# Patient Record
Sex: Male | Born: 1965 | Race: White | Hispanic: No | Marital: Single | Smoking: Never smoker
Health system: Southern US, Community
[De-identification: ages and names within clinical notes are randomized; demographics above are authoritative.]

## PROBLEM LIST (undated history)

## (undated) DIAGNOSIS — G4730 Sleep apnea, unspecified: Secondary | ICD-10-CM

## (undated) DIAGNOSIS — M1990 Unspecified osteoarthritis, unspecified site: Secondary | ICD-10-CM

## (undated) DIAGNOSIS — M19049 Primary osteoarthritis, unspecified hand: Secondary | ICD-10-CM

## (undated) DIAGNOSIS — Z98811 Dental restoration status: Secondary | ICD-10-CM

## (undated) DIAGNOSIS — I10 Essential (primary) hypertension: Secondary | ICD-10-CM

## (undated) DIAGNOSIS — S60419A Abrasion of unspecified finger, initial encounter: Secondary | ICD-10-CM

## (undated) DIAGNOSIS — F319 Bipolar disorder, unspecified: Secondary | ICD-10-CM

## (undated) DIAGNOSIS — G4710 Hypersomnia, unspecified: Secondary | ICD-10-CM

## (undated) DIAGNOSIS — M6740 Ganglion, unspecified site: Secondary | ICD-10-CM

## (undated) DIAGNOSIS — F32A Depression, unspecified: Secondary | ICD-10-CM

## (undated) DIAGNOSIS — F329 Major depressive disorder, single episode, unspecified: Secondary | ICD-10-CM

## (undated) HISTORY — DX: Essential (primary) hypertension: I10

## (undated) HISTORY — PX: TONSILLECTOMY AND ADENOIDECTOMY: SUR1326

## (undated) HISTORY — DX: Depression, unspecified: F32.A

## (undated) HISTORY — DX: Sleep apnea, unspecified: G47.30

## (undated) HISTORY — PX: APPENDECTOMY: SHX54

## (undated) HISTORY — DX: Hypersomnia, unspecified: G47.10

## (undated) HISTORY — PX: CHOLECYSTECTOMY: SHX55

## (undated) HISTORY — DX: Major depressive disorder, single episode, unspecified: F32.9

---

## 2005-05-05 ENCOUNTER — Encounter: Admission: RE | Admit: 2005-05-05 | Discharge: 2005-05-05 | Payer: Self-pay | Admitting: Family Medicine

## 2011-04-30 ENCOUNTER — Inpatient Hospital Stay (INDEPENDENT_AMBULATORY_CARE_PROVIDER_SITE_OTHER)
Admission: RE | Admit: 2011-04-30 | Discharge: 2011-04-30 | Disposition: A | Discharge status: Home / Self Care | Payer: BC Managed Care – PPO | Source: Ambulatory Visit | Attending: Emergency Medicine | Admitting: Emergency Medicine

## 2011-04-30 DIAGNOSIS — S61409A Unspecified open wound of unspecified hand, initial encounter: Secondary | ICD-10-CM

## 2012-11-13 ENCOUNTER — Ambulatory Visit (INDEPENDENT_AMBULATORY_CARE_PROVIDER_SITE_OTHER): Payer: BC Managed Care – PPO | Admitting: Sports Medicine

## 2012-11-13 VITALS — BP 136/70 | Ht 72.0 in | Wt 225.0 lb

## 2012-11-13 DIAGNOSIS — M25579 Pain in unspecified ankle and joints of unspecified foot: Secondary | ICD-10-CM

## 2012-11-13 DIAGNOSIS — M25571 Pain in right ankle and joints of right foot: Secondary | ICD-10-CM

## 2012-11-13 DIAGNOSIS — R269 Unspecified abnormalities of gait and mobility: Secondary | ICD-10-CM

## 2012-11-13 NOTE — Assessment & Plan Note (Signed)
We are getting relatively good correction of his pronation with the sports insoles.  If this does not work adequately to get rid of his symptoms I would consider doing custom orthotics.

## 2012-11-13 NOTE — Assessment & Plan Note (Signed)
Some of his symptoms could be suggestive of Morton's neuroma but more of the same suggestive of tarsal tunnel. This is particularly true as he takes his shoe off and he gets better.  Today we added sports insoles with metatarsal pad and with arch support.  We added these to his over-the-counter Spenco insoles as well.  He will try these over the next 6 weeks to see if this lessens his symptoms.

## 2012-11-13 NOTE — Progress Notes (Signed)
  Subjective:    Patient ID: Christopher Collins, male    DOB: Mar 30, 1966, 47 y.o.   MRN: [id number]  HPI  Pt presents to clinic for evaluation of rt foot pain since 2008. Has a burning sensation in rt forefoot with driving mostly.  Burning sensation gets better with taking shoe off to drive. Has improved with getting wider shoes and spinco OTC orthotic.   In the last few months has developed burning sensation with running on treadmill.  Has been told he has a probable neuroma but his testing has never confirmed that    Review of Systems     Objective:   Physical Exam  NAD   Rt foot  No abnormal callusing over MT area Cavus foot in seated position Loss of Rt long arch more than on lt Mild flattening of trans arch on rt, not on lt Slight calcaneal varus  Strikes in supination with walking, no abnormal pronation Good great toe motion Tinnel's at tarsal tunnel - negative  Slight pain over 2nd MT distal shaft Quarter test- hypersensitive on rt 2-3 interspace   Leg lengths equal Good alignment  Running gait - pronates dynamically on rt - forefoot to midfoot strike         Assessment & Plan:

## 2012-12-24 ENCOUNTER — Ambulatory Visit (INDEPENDENT_AMBULATORY_CARE_PROVIDER_SITE_OTHER): Payer: BC Managed Care – PPO | Admitting: Sports Medicine

## 2012-12-24 VITALS — BP 138/81 | Ht 72.0 in | Wt 225.0 lb

## 2012-12-24 DIAGNOSIS — M25579 Pain in unspecified ankle and joints of unspecified foot: Secondary | ICD-10-CM

## 2012-12-24 DIAGNOSIS — M25571 Pain in right ankle and joints of right foot: Secondary | ICD-10-CM

## 2012-12-24 NOTE — Assessment & Plan Note (Signed)
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

## 2012-12-24 NOTE — Progress Notes (Signed)
  Subjective:    Patient ID: Christopher Collins, male    DOB: [birth year], 47 y.o.   MRN: [id number]  HPI  Pt presents to clinic for f/u of rt foot pain which has improved about 25-30% since last visit. Alternating between spinco and green hapad insoles with scaphoid and metatarsal pads on rt. Sxs with running were much less Sxs now with ADLs are very mild Not using any medications and we help off HEP until we saw response   Review of Systems     Objective:   Physical Exam NAD  Rt foot exam: Moderate arch collapse on standing on rt Cavus shape on sitting Posterior tib function diminished on rt  Stands in slight supination  Walks with no limp  No TTP         Assessment & Plan:

## 2012-12-24 NOTE — Patient Instructions (Addendum)
Start Vitamin B6 50 mg twice daily  Do not use ice in that area  Exercises: Walk pigeon toed across a room 5-10 times forward Walk backward with feet in neutral position  Do step ups - with toes turned in 3 sets of 15  Do heel raises with toes turned in 3 sets of  15  Please follow up in 6 weeks  Thank you for seeing Christopher Collins today!

## 2012-12-25 ENCOUNTER — Ambulatory Visit: Payer: BC Managed Care – PPO | Admitting: Sports Medicine

## 2013-02-05 ENCOUNTER — Ambulatory Visit: Payer: BC Managed Care – PPO | Admitting: Sports Medicine

## 2013-02-06 ENCOUNTER — Encounter: Payer: Self-pay | Admitting: Neurology

## 2013-02-06 ENCOUNTER — Ambulatory Visit (INDEPENDENT_AMBULATORY_CARE_PROVIDER_SITE_OTHER): Payer: BC Managed Care – PPO | Admitting: Neurology

## 2013-02-06 VITALS — BP 136/84 | HR 68 | Temp 98.3°F | Ht 73.0 in | Wt 233.0 lb

## 2013-02-06 DIAGNOSIS — G4730 Sleep apnea, unspecified: Secondary | ICD-10-CM

## 2013-02-06 DIAGNOSIS — G4733 Obstructive sleep apnea (adult) (pediatric): Secondary | ICD-10-CM

## 2013-02-06 DIAGNOSIS — G4700 Insomnia, unspecified: Secondary | ICD-10-CM

## 2013-02-06 MED ORDER — ZOLPIDEM TARTRATE 10 MG PO TABS: 10.0000 mg | ORAL_TABLET | Freq: Every evening | ORAL | Status: DC | PRN

## 2013-02-06 NOTE — Addendum Note (Signed)
Addended by: Melvyn Novas on: 02/06/2013 11:43 AM   Modules accepted: Orders

## 2013-02-06 NOTE — Progress Notes (Addendum)
[facility]  Provider:  Dr Ashly Yepez Referring Provider: Hollice Espy, MD Primary Care Physician:  Hollice Espy, MD  Chief Complaint  Patient presents with  . Follow-up    sleep study f/u, rm 10    HPI:  Christopher Collins is a 47 y.o. male here as a referral from Dr. Andee Poles  for sleep problems. his PCP is Dr Shaune Pollack . As long was seen on 11/07/2012 and a sleep consult following a split night study the patient hadn't AHI of 26 during his nocturnal study dated 08-20-12 he had reported snoring and nonrestorative sleep resulting in increased and excessive daytime sleepiness. His Epworth sleepiness score was endorsed at 11 points pre study and at 8 points in February .    His PSG was converted to a split study after the AHI of 26 was found, but was a strong supine positional association with the AHI rising to 55 hour in supine sleep. The patient was titrated to a 9 cm water pressure but it almost equally weight well on 8 cm his followup visit in the first 90 days on CPAP showed that she had a residual AHI at 5.9. [facility] download the machine in mid-February the patient now brought his laptop is his appointment with his own data.  Was evidence of central apneas  in February, his last download, and  I reduced his over all pressure to 7 cm, but his AHi is now about 9 /hr , much worse.  He may have more complex apnea.   The graphic data show that the patient seems to have 2-3 periods at night with higher AHI [place] after midnight and are likely related to REM sleep. The patient's belief that he sleeps mostly in nonsupine position, but we will discuss a tennis ball method to make sure that he doesn't resume supine sleep inadvertently . The patient has mild retrognathia. He uses a dental device to advacne his mandibular as well as avoiding bruxism.  The patient has meanwhile discontinued testosterone supplement , and started on Lamictal. The  testosterone seems to have had little at evidence of helping but is fatigued or sleep. I advised him that lamictal , which recently was started  By dr.McK. sometimes causes delayed sleep onset, and  he may want to make sure that he takes a medicine in the morning  Epworth 10 points but  with a note that this was" before Ambien use, otherwise rather 8 points or less" The patient has started in mid April to take Ambien for sleep induction and he now reports that his sleepiness is improved and that he gets restorative sleep at night.  He estimates his nocturnal sleep time at 7 hours, because so between 11 PM at midnight to bed and right is about 7:30 AM.   Nocturia was reported before CPAP he was has now resolved he may have one bathroom break at the most at night. No  breakthrough snoring is reported     Review of Systems: Out of a complete 14 system review, the patient complains of only the following symptoms, and all other reviewed systems are negative.  lites improved daytime sleepiness with an Epworth of 10 pints. Patient uses Ambien to gain more restful, deeper asleep. Patient AH I is increased after recent changes to his CPAP setting.   History   Social History  . Marital Status: Single    Spouse Name: N/A    Number of Children: 0  .  Years of Education: PHD   Occupational History  .  [employer]    non shift worker   Social History Main Topics  . Smoking status: Never Smoker   . Smokeless tobacco: Not on file  . Alcohol Use: Yes     Comment: not more than 4 drinks per week  . Drug Use: No  . Sexually Active: Not on file   Other Topics Concern  . Not on file   Social History Narrative   Epworth on 8 cm CPAP is down to 8 from 11 cm, but nasal pillow doesn't fit as well as expected. Has used CPAP four or more hours nightly, 100% compliance.   His download  showed an AHI of 5.9 and mostly consists of  central apneas.  This indicated too high of a pressure. Suggested reduction to 7 cm  water and 3 cm flex and a pilaire mask to avoid dislodgement and an  air leak. [facility] is DME.   BMI addressed with PCP.  Visit 30 minutes.         No family history on file.  Past Medical History  Diagnosis Date  . Hypertension   . Depression   . Snoring     newly diagnosed OSA-AHI of 26  . Sleep apnea with use of continuous positive airway pressure (CPAP)      PSG  08-20-12 , AHI of 26, CPAP  9 cm water DME  [facility].     Past Surgical History  Procedure Laterality Date  . Gallbladder surgery  2002  . Tonsillectomy  1970    Current Outpatient Prescriptions  Medication Sig Dispense Refill  . amLODipine (NORVASC) 5 MG tablet Take 5 mg by mouth daily.      . Cholecalciferol (VITAMIN D3) 5000 UNITS CAPS Take by mouth 2 (two) times daily.      Marland Kitchen FLUoxetine (PROZAC) 20 MG tablet Take 20 mg by mouth daily.      Marland Kitchen lamoTRIgine (LAMICTAL) 100 MG tablet Take 100 mg by mouth daily.      Marland Kitchen lisinopril (PRINIVIL,ZESTRIL) 10 MG tablet Take 10 mg by mouth daily.      . Methylcobalamin 1 MG CHEW Chew by mouth.      . Multiple Vitamins-Minerals (MULTIVITAMIN PO) Take by mouth daily.      . OMEGA 3 1200 MG CAPS Take by mouth 2 (two) times daily.      . Pregnenolone POWD 100 mg by Does not apply route daily.       No current facility-administered medications for this visit.    Allergies as of 02/06/2013  . (No Known Allergies)    Vitals: BP 136/84  Pulse 68  Temp(Src) 98.3 F (36.8 C) (Oral)  Ht 6\' 1"  (1.854 m)  Wt 233 lb (105.688 kg)  BMI 30.75 kg/m2 Last Weight:  Wt Readings from Last 1 Encounters:  02/06/13 233 lb (105.688 kg)   Last Height:   Ht Readings from Last 1 Encounters:  02/06/13 6\' 1"  (1.854 m)   Vision Screening:   Physical exam:  General: The patient is awake, alert and appears not in acute distress. The patient is well groomed. Head: Normocephalic, atraumatic. Neck is supple. Mallampati 3 , neck circumference:17.5 , retrognathia,   No nasal deviation, can breath  through either nasion. Allergic rhinitis has not been bothering him recently.  Cardiovascular:  Regular rate and rhythm , without  murmurs or carotid bruit, and without distended neck veins. Respiratory: Lungs are clear to auscultation. Skin:  Without evidence of edema, or rash Trunk: BMI is elevated and patient  has normal posture.  Neurologic exam : The patient is awake and alert, oriented to place and time.  Memory subjective  described as intact. There is a normal attention span & concentration ability.  Speech is fluent without  dysarthria, dysphonia or aphasia. Mood and affect are appropriate.  Cranial nerves: Pupils are equal and briskly reactive to light.Extraocular movements  in vertical and horizontal planes intact and without nystagmus. Visual fields by finger perimetry are intact. Hearing to finger rub intact.  Facial sensation intact to fine touch. Facial motor strength is symmetric and tongue and uvula move midline.  Motor exam:   Normal tone and normal muscle bulk and symmetric normal strength in all extremities.  Sensory:  Fine touch, pinprick and vibration were tested as normal.   Coordination: Rapid alternating movements in the fingers/hands is tested and normal. Patient has slight tremor with action.   Gait and station: Patient walks without assistive device and is able and assisted stool climb up to the exam table. Strength within normal limits.Steps are unfragmented.  Deep tendon reflexes: in the  upper and lower extremities are symmetric and intact. Babinski maneuver response is  downgoing.   Assessment:  After physical and neurologic examination, review of laboratory studies, imaging, neurophysiology testing and pre-existing records, assessment will be reviewed on the problem list.  Plan:  Treatment plan and additional workup will be reviewed under Problem List.   , Excessive daytime sleepiness and tested positive for sleep apnea. The patient is on a therapeutic  CPAP, but a recent pressure reduction at night to help him to improve his insomnia and further increased his residual AH I. I have decided to resume an extra exceed the initial pressure of  9 cm water with a 3 cm flex function.  He will continue using his nasal mask, but she prefers over a nasal pillow.  The patient has retrognathia, status post tonsillectomy ,   And has  a high grade Mallampati due to elongated uvula, soft tissue excess.  He is using a dental device at night as a bite guard and mandibular device. .  The dental device preceded the use of CPAP by many years.  His insomnia seems to have responded to the Ambien treatment, initiated by his PCP mid- April, and this is reflected in his Epworth score also he and Doris 2 date 10 pints he states that this was prior to Ambien and may now range around 8 or less. The patient is not a shift worker he has regular bedtimes reports nor of the interruption of his sleep and resolution of nocturia.   I am willing to prescribe the Ambien for him but would like him to use a half tablet at night and to reduce the Ambien to 5 nights a week. The apnea and the insomnia not causally related.

## 2013-02-06 NOTE — Patient Instructions (Addendum)
CPAP and BIPAP CPAP and BIPAP are methods of helping you breathe. CPAP stands for "continuous positive airway pressure." BIPAP stands for "bi-level positive airway pressure." Both CPAP and BIPAP are provided by a small machine with a flexible plastic tube that attaches to a plastic mask that goes over your nose or mouth. Air is blown into your air passages through your nose or mouth. This helps to keep your airways open and helps to keep you breathing well. The amount of pressure that is used to blow the air into your air passages can be set on the machine. The pressure setting is based on your needs. With CPAP, the amount of pressure stays the same while you breathe in and out. With BIPAP, the amount of pressure changes when you inhale and exhale. Your caregiver will recommend whether CPAP or BIPAP would be more helpful for you.  CPAP and BIPAP can be helpful for both adults and children with:  Sleep apnea.  Chronic Obstructive Pulmonary Disease (COPD), a condition like emphysema.  Diseases which weaken the muscles of the chest such as muscular dystrophy or neurological diseases.  Other problems that cause breathing to be weak or difficult. USE OF CPAP OR BIPAP The respiratory therapist or technician will help you get used to wearing the mask. Some people feel claustrophobic (a trapped or closed in feeling) at first, because the mask needs to be fairly snug on your face.   It may help you to get used to the mask gradually, by first holding the mask loosely over your nose or mouth using a low pressure setting on the machine. Gradually the mask can be applied more snugly with increased pressure. You can also gradually increase the amount of time the mask is used.  People with sleep apnea will use the mask and machine at night when they are sleeping. Others, like those with ALS or other breathing difficulties, may need the CPAP or BIPAP all the time.  If the first mask you try does not fit well, or  is uncomfortable, there are other types and sizes that can be tried.  If you tend to breathe through your mouth, a chin strap may be applied to help keep your mouth closed (if you are using a nasal mask).  The CPAP and BIPAP machines have alarms that may sound if the mask comes off or develops a leak.  You should not eat or drink while the CPAP or BIPAP is on. Food or fluids could get pushed into your lungs by the pressure of the CPAP or BIPAP. Sometimes CPAP or BIPAP machines are ordered for home use. If you are going to use the CPAP or BIPAP machine at home, follow these instructions  CPAP or BIPAP machines can be rented or purchased through home health care companies. There are many different brands of machines available. If you rent a machine before purchasing you may find which particular machine works well for you.  Ask questions if there is something you do not understand when picking out your machine.  Place your CPAP or BIPAP machine on a secure table or stand near an electrical outlet.  Know where the On/Off switch is.  Follow your doctor's instructions for how to set the pressure on your machine and when you should use it.  Do not smoke! Tobacco smoke residue can damage the machine. SEEK IMMEDIATE MEDICAL CARE IF:   You have redness or open areas around your nose or mouth.  You have trouble operating   the CPAP or BIPAP machine.  You cannot tolerate wearing the CPAP or BIPAP mask.  You have any questions or concerns. Document Released: 05/27/2004 Document Revised: 11/21/2011 Document Reviewed: 08/26/2008 Exit[employer] Patient Information 2014 [employer]. Sleep Apnea  Sleep apnea is a sleep disorder characterized by abnormal pauses in breathing while you sleep. When your breathing pauses, the level of oxygen in your blood decreases. This causes you to move out of deep sleep and into light sleep. As a result, your quality of sleep is poor, and the system that carries your blood  throughout your body (cardiovascular system) experiences stress. If sleep apnea remains untreated, the following conditions can develop:  High blood pressure (hypertension).  Coronary artery disease.  Inability to achieve or maintain an erection (impotence).  Impairment of your thought process (cognitive dysfunction). There are three types of sleep apnea: 1. Obstructive sleep apnea Pauses in breathing during sleep because of a blocked airway. 2. Central sleep apnea Pauses in breathing during sleep because the area of the brain that controls your breathing does not send the correct signals to the muscles that control breathing. 3. Mixed sleep apnea A combination of both obstructive and central sleep apnea. RISK FACTORS The following risk factors can increase your risk of developing sleep apnea:  Being overweight.  Smoking.  Having narrow passages in your nose and throat.  Being of older age.  Being male.  Alcohol use.  Sedative and tranquilizer use.  Ethnicity. Among individuals younger than 35 years, African Americans are at increased risk of sleep apnea. SYMPTOMS   Difficulty staying asleep.  Daytime sleepiness and fatigue.  Loss of energy.  Irritability.  Loud, heavy snoring.  Morning headaches.  Trouble concentrating.  Forgetfulness.  Decreased interest in sex. DIAGNOSIS  In order to diagnose sleep apnea, your caregiver will perform a physical examination. Your caregiver may suggest that you take a home sleep test. Your caregiver may also recommend that you spend the night in a sleep lab. In the sleep lab, several monitors record information about your heart, lungs, and brain while you sleep. Your leg and arm movements and blood oxygen level are also recorded. TREATMENT The following actions may help to resolve mild sleep apnea:  Sleeping on your side.   Using a decongestant if you have nasal congestion.   Avoiding the use of depressants, including  alcohol, sedatives, and narcotics.   Losing weight and modifying your diet if you are overweight. There also are devices and treatments to help open your airway:  Oral appliances. These are custom-made mouthpieces that shift your lower jaw forward and slightly open your bite. This opens your airway.  Devices that create positive airway pressure. This positive pressure "splints" your airway open to help you breathe better during sleep. The following devices create positive airway pressure:  Continuous positive airway pressure (CPAP) device. The CPAP device creates a continuous level of air pressure with an air pump. The air is delivered to your airway through a mask while you sleep. This continuous pressure keeps your airway open.  Nasal expiratory positive airway pressure (EPAP) device. The EPAP device creates positive air pressure as you exhale. The device consists of single-use valves, which are inserted into each nostril and held in place by adhesive. The valves create very little resistance when you inhale but create much more resistance when you exhale. That increased resistance creates the positive airway pressure. This positive pressure while you exhale keeps your airway open, making it easier to breath when you   inhale again.  Bilevel positive airway pressure (BPAP) device. The BPAP device is used mainly in patients with central sleep apnea. This device is similar to the CPAP device because it also uses an air pump to deliver continuous air pressure through a mask. However, with the BPAP machine, the pressure is set at two different levels. The pressure when you exhale is lower than the pressure when you inhale.  Surgery. Typically, surgery is only done if you cannot comply with less invasive treatments or if the less invasive treatments do not improve your condition. Surgery involves removing excess tissue in your airway to create a wider passage way. Document Released: 08/19/2002 Document  Revised: 02/28/2012 Document Reviewed: 01/05/2012 Exit[employer] Patient Information 2014 [employer]. Exercise to Lose Weight Exercise and a healthy diet may help you lose weight. Your doctor may suggest specific exercises. EXERCISE IDEAS AND TIPS  Choose low-cost things you enjoy doing, such as walking, bicycling, or exercising to workout videos.  Take stairs instead of the elevator.  Walk during your lunch break.  Park your car further away from work or school.  Go to a gym or an exercise class.  Start with 5 to 10 minutes of exercise each day. Build up to 30 minutes of exercise 4 to 6 days a week.  Wear shoes with good support and comfortable clothes.  Stretch before and after working out.  Work out until you breathe harder and your heart beats faster.  Drink extra water when you exercise.  Do not do so much that you hurt yourself, feel dizzy, or get very short of breath. Exercises that burn about 150 calories:  Running 1  miles in 15 minutes.  Playing volleyball for 45 to 60 minutes.  Washing and waxing a car for 45 to 60 minutes.  Playing touch football for 45 minutes.  Walking 1  miles in 35 minutes.  Pushing a stroller 1  miles in 30 minutes.  Playing basketball for 30 minutes.  Raking leaves for 30 minutes.  Bicycling 5 miles in 30 minutes.  Walking 2 miles in 30 minutes.  Dancing for 30 minutes.  Shoveling snow for 15 minutes.  Swimming laps for 20 minutes.  Walking up stairs for 15 minutes.  Bicycling 4 miles in 15 minutes.  Gardening for 30 to 45 minutes.  Jumping rope for 15 minutes.  Washing windows or floors for 45 to 60 minutes. Document Released: 10/01/2010 Document Revised: 11/21/2011 Document Reviewed: 10/01/2010 [employer] Patient Information 2014 [employer].  

## 2013-02-21 ENCOUNTER — Encounter: Payer: Self-pay | Admitting: Neurology

## 2013-02-28 ENCOUNTER — Encounter: Payer: Self-pay | Admitting: Sports Medicine

## 2013-02-28 ENCOUNTER — Ambulatory Visit (INDEPENDENT_AMBULATORY_CARE_PROVIDER_SITE_OTHER): Payer: BC Managed Care – PPO | Admitting: Sports Medicine

## 2013-02-28 VITALS — BP 121/76 | HR 59 | Ht 73.0 in | Wt 237.0 lb

## 2013-02-28 DIAGNOSIS — M25579 Pain in unspecified ankle and joints of unspecified foot: Secondary | ICD-10-CM

## 2013-02-28 DIAGNOSIS — M25571 Pain in right ankle and joints of right foot: Secondary | ICD-10-CM

## 2013-02-28 DIAGNOSIS — R269 Unspecified abnormalities of gait and mobility: Secondary | ICD-10-CM

## 2013-02-28 NOTE — Patient Instructions (Addendum)
You have high impact gait so you need to stay in cushioned insoles to keep from tibial nerve pressure at tarsal tunnel  Scaphoid pads block pronation Metatarsal pad blocks distal nerve irritation (not a true neuroma)  Modify exercises Keep up step ups Start working the pigeon toed heel lift on a book and not a step until RT ankle is stronger Use the left as your control and advance to a step if it gets stronger  OK to ease into some running when weather allows  Shoes - curve last with good midfoot support/ no rigid pronation counter  See me in 6 mos

## 2013-02-28 NOTE — Assessment & Plan Note (Signed)
This is significantly improved  He is given a catalog to buy the sports insoles and the various pads-scaphoid for the arch and metatarsal pad  Continue on exercises and the vitamin B6

## 2013-02-28 NOTE — Progress Notes (Signed)
Patient ID: MENDELL BONTEMPO, male   DOB: 1966-01-03, 47 y.o.   MRN: [id number]  Since last seen Step up exercises were fine as were walking drills Eccentric on step were painful in arch and caused some sharp nerve pain Insoles are helpful Pain reduced by 40% Also takes B6 Walking briskly for 45 mins most days and he does some running  Doing the exercises he has noted that his right ankle is weaker for heel raises and dorsiflexion  Compared to first visit he has no pain with driving or in activities of daily living  No acute distress  RT Ankle: No visible erythema or swelling. Range of motion is full in all directions. Strength is 5/5 in all directions. Stable lateral and medial ligaments; squeeze test and kleiger test unremarkable; Talar dome nontender; No pain at base of 5th MT; No tenderness over cuboid; No tenderness over N spot or navicular prominence No tenderness on posterior aspects of lateral and medial malleolus No sign of peroneal tendon subluxations; Negative tarsal tunnel tinel's Able to walk 4 steps.  Cavus-type arch but some arch collapse when he stands No tenderness over the distal metatarsals  Running gait Forefoot strike with some varus Pronation is controlled with the scaphoid arch support on the right

## 2013-02-28 NOTE — Assessment & Plan Note (Signed)
He is getting good results with sports insoles  We will defer making custom orthotics  Recheck with me in 6 months or when necessary

## 2013-03-04 ENCOUNTER — Other Ambulatory Visit: Payer: Self-pay | Admitting: Orthopedic Surgery

## 2013-03-12 DIAGNOSIS — M19049 Primary osteoarthritis, unspecified hand: Secondary | ICD-10-CM

## 2013-03-12 DIAGNOSIS — M6740 Ganglion, unspecified site: Secondary | ICD-10-CM

## 2013-03-12 HISTORY — DX: Primary osteoarthritis, unspecified hand: M19.049

## 2013-03-12 HISTORY — PX: FINGER SURGERY: SHX640

## 2013-03-12 HISTORY — DX: Ganglion, unspecified site: M67.40

## 2013-03-21 ENCOUNTER — Encounter (HOSPITAL_BASED_OUTPATIENT_CLINIC_OR_DEPARTMENT_OTHER): Payer: Self-pay | Admitting: *Deleted

## 2013-03-21 NOTE — Pre-Procedure Instructions (Signed)
To come for EKG 

## 2013-03-25 ENCOUNTER — Encounter (HOSPITAL_BASED_OUTPATIENT_CLINIC_OR_DEPARTMENT_OTHER)
Admission: RE | Admit: 2013-03-25 | Discharge: 2013-03-25 | Disposition: A | Discharge status: Home / Self Care | Payer: BC Managed Care – PPO | Source: Ambulatory Visit | Attending: Orthopedic Surgery | Admitting: Orthopedic Surgery

## 2013-03-25 NOTE — Progress Notes (Signed)
EKG reviewed by Dr Chaney Malling. Obtain old EKG from primary. 1235 Old EKG reviewed by Dr Chaney Malling. No new orders stable EKG at this time

## 2013-03-27 ENCOUNTER — Encounter (HOSPITAL_BASED_OUTPATIENT_CLINIC_OR_DEPARTMENT_OTHER): Payer: Self-pay | Admitting: Anesthesiology

## 2013-03-27 ENCOUNTER — Encounter (HOSPITAL_BASED_OUTPATIENT_CLINIC_OR_DEPARTMENT_OTHER)
Admission: RE | Disposition: A | Discharge status: Home / Self Care | Payer: Self-pay | Source: Ambulatory Visit | Attending: Orthopedic Surgery

## 2013-03-27 ENCOUNTER — Ambulatory Visit (HOSPITAL_BASED_OUTPATIENT_CLINIC_OR_DEPARTMENT_OTHER)
Admission: RE | Admit: 2013-03-27 | Discharge: 2013-03-27 | Disposition: A | Discharge status: Home / Self Care | Payer: BC Managed Care – PPO | Source: Ambulatory Visit | Attending: Orthopedic Surgery | Admitting: Orthopedic Surgery

## 2013-03-27 ENCOUNTER — Ambulatory Visit (HOSPITAL_BASED_OUTPATIENT_CLINIC_OR_DEPARTMENT_OTHER): Payer: BC Managed Care – PPO | Admitting: Anesthesiology

## 2013-03-27 ENCOUNTER — Encounter (HOSPITAL_BASED_OUTPATIENT_CLINIC_OR_DEPARTMENT_OTHER): Payer: Self-pay | Admitting: Orthopedic Surgery

## 2013-03-27 DIAGNOSIS — Z91011 Allergy to milk products: Secondary | ICD-10-CM | POA: Insufficient documentation

## 2013-03-27 DIAGNOSIS — I10 Essential (primary) hypertension: Secondary | ICD-10-CM | POA: Insufficient documentation

## 2013-03-27 DIAGNOSIS — D2110 Benign neoplasm of connective and other soft tissue of unspecified upper limb, including shoulder: Secondary | ICD-10-CM | POA: Insufficient documentation

## 2013-03-27 DIAGNOSIS — H9319 Tinnitus, unspecified ear: Secondary | ICD-10-CM | POA: Insufficient documentation

## 2013-03-27 DIAGNOSIS — F3130 Bipolar disorder, current episode depressed, mild or moderate severity, unspecified: Secondary | ICD-10-CM | POA: Insufficient documentation

## 2013-03-27 DIAGNOSIS — M19049 Primary osteoarthritis, unspecified hand: Secondary | ICD-10-CM | POA: Insufficient documentation

## 2013-03-27 DIAGNOSIS — Z79899 Other long term (current) drug therapy: Secondary | ICD-10-CM | POA: Insufficient documentation

## 2013-03-27 DIAGNOSIS — M47812 Spondylosis without myelopathy or radiculopathy, cervical region: Secondary | ICD-10-CM | POA: Insufficient documentation

## 2013-03-27 DIAGNOSIS — Z91018 Allergy to other foods: Secondary | ICD-10-CM | POA: Insufficient documentation

## 2013-03-27 DIAGNOSIS — G4730 Sleep apnea, unspecified: Secondary | ICD-10-CM | POA: Insufficient documentation

## 2013-03-27 HISTORY — DX: Bipolar disorder, unspecified: F31.9

## 2013-03-27 HISTORY — DX: Primary osteoarthritis, unspecified hand: M19.049

## 2013-03-27 HISTORY — DX: Abrasion of unspecified finger, initial encounter: S60.419A

## 2013-03-27 HISTORY — DX: Unspecified osteoarthritis, unspecified site: M19.90

## 2013-03-27 HISTORY — DX: Dental restoration status: Z98.811

## 2013-03-27 HISTORY — DX: Ganglion, unspecified site: M67.40

## 2013-03-27 LAB — POCT I-STAT, CHEM 8
Hemoglobin: 15.6 g/dL (ref 13.0–17.0)
Sodium: 141 mEq/L (ref 135–145)
TCO2: 25 mmol/L (ref 0–100)

## 2013-03-27 SURGERY — EXCISION METACARPAL MASS
Anesthesia: Regional | Site: Hand | Laterality: Right | Wound class: Clean

## 2013-03-27 MED ORDER — OXYCODONE HCL 5 MG PO TABS: 5.0000 mg | ORAL_TABLET | Freq: Once | ORAL | Status: DC | PRN

## 2013-03-27 MED ORDER — PROPOFOL INFUSION 10 MG/ML OPTIME
INTRAVENOUS | Status: DC | PRN
  Administered 2013-03-27: 75 ug/kg/min via INTRAVENOUS

## 2013-03-27 MED ORDER — ONDANSETRON HCL 4 MG/2ML IJ SOLN
INTRAMUSCULAR | Status: DC | PRN
  Administered 2013-03-27: 4 mg via INTRAVENOUS

## 2013-03-27 MED ORDER — CHLORHEXIDINE GLUCONATE 4 % EX LIQD: 60.0000 mL | Freq: Once | CUTANEOUS | Status: DC

## 2013-03-27 MED ORDER — MIDAZOLAM HCL 2 MG/ML PO SYRP: 12.0000 mg | ORAL_SOLUTION | Freq: Once | ORAL | Status: DC | PRN

## 2013-03-27 MED ORDER — HYDROCODONE-ACETAMINOPHEN 5-325 MG PO TABS: 1.0000 | ORAL_TABLET | Freq: Four times a day (QID) | ORAL | Status: DC | PRN

## 2013-03-27 MED ORDER — MIDAZOLAM HCL 2 MG/2ML IJ SOLN: 1.0000 mg | INTRAMUSCULAR | Status: DC | PRN

## 2013-03-27 MED ORDER — OXYCODONE HCL 5 MG/5ML PO SOLN: 5.0000 mg | Freq: Once | ORAL | Status: DC | PRN

## 2013-03-27 MED ORDER — FENTANYL CITRATE 0.05 MG/ML IJ SOLN
50.0000 ug | INTRAMUSCULAR | Status: DC | PRN
Start: 2013-03-27 — End: 2013-03-27

## 2013-03-27 MED ORDER — LACTATED RINGERS IV SOLN
INTRAVENOUS | Status: DC
  Administered 2013-03-27: 11:00:00 via INTRAVENOUS

## 2013-03-27 MED ORDER — ONDANSETRON HCL 4 MG/2ML IJ SOLN: 4.0000 mg | Freq: Once | INTRAMUSCULAR | Status: DC | PRN

## 2013-03-27 MED ORDER — FENTANYL CITRATE 0.05 MG/ML IJ SOLN
INTRAMUSCULAR | Status: DC | PRN
  Administered 2013-03-27: 50 ug via INTRAVENOUS

## 2013-03-27 MED ORDER — HYDROMORPHONE HCL PF 1 MG/ML IJ SOLN: 0.2500 mg | INTRAMUSCULAR | Status: DC | PRN

## 2013-03-27 MED ORDER — CEFAZOLIN SODIUM-DEXTROSE 2-3 GM-% IV SOLR
2.0000 g | INTRAVENOUS | Status: AC
  Administered 2013-03-27: 2 g via INTRAVENOUS

## 2013-03-27 MED ORDER — LIDOCAINE HCL (CARDIAC) 20 MG/ML IV SOLN
INTRAVENOUS | Status: DC | PRN
  Administered 2013-03-27: 50 mg via INTRAVENOUS

## 2013-03-27 MED ORDER — DEXAMETHASONE SODIUM PHOSPHATE 10 MG/ML IJ SOLN
INTRAMUSCULAR | Status: DC | PRN
  Administered 2013-03-27: 10 mg via INTRAVENOUS

## 2013-03-27 MED ORDER — MIDAZOLAM HCL 5 MG/5ML IJ SOLN
INTRAMUSCULAR | Status: DC | PRN
  Administered 2013-03-27: 2 mg via INTRAVENOUS

## 2013-03-27 MED ORDER — BUPIVACAINE HCL (PF) 0.25 % IJ SOLN
INTRAMUSCULAR | Status: DC | PRN
  Administered 2013-03-27: 6 mL

## 2013-03-27 MED ORDER — MEPERIDINE HCL 25 MG/ML IJ SOLN: 6.2500 mg | INTRAMUSCULAR | Status: DC | PRN

## 2013-03-27 SURGICAL SUPPLY — 52 items
BANDAGE COBAN STERILE 2 (GAUZE/BANDAGES/DRESSINGS) IMPLANT
BANDAGE GAUZE ELAST BULKY 4 IN (GAUZE/BANDAGES/DRESSINGS) IMPLANT
BLADE MINI RND TIP GREEN BEAV (BLADE) IMPLANT
BLADE SURG 15 STRL LF DISP TIS (BLADE) ×1 IMPLANT
BLADE SURG 15 STRL SS (BLADE) ×1
BNDG COHESIVE 1X5 TAN STRL LF (GAUZE/BANDAGES/DRESSINGS) IMPLANT
BNDG COHESIVE 3X5 TAN STRL LF (GAUZE/BANDAGES/DRESSINGS) IMPLANT
BNDG ESMARK 4X9 LF (GAUZE/BANDAGES/DRESSINGS) ×2 IMPLANT
CHLORAPREP W/TINT 26ML (MISCELLANEOUS) ×2 IMPLANT
CLOTH BEACON ORANGE TIMEOUT ST (SAFETY) ×2 IMPLANT
CORDS BIPOLAR (ELECTRODE) ×2 IMPLANT
COVER MAYO STAND STRL (DRAPES) ×2 IMPLANT
COVER TABLE BACK 60X90 (DRAPES) ×2 IMPLANT
CUFF TOURNIQUET SINGLE 18IN (TOURNIQUET CUFF) ×2 IMPLANT
DECANTER SPIKE VIAL GLASS SM (MISCELLANEOUS) IMPLANT
DRAIN PENROSE 1/2X12 LTX STRL (WOUND CARE) IMPLANT
DRAPE EXTREMITY T 121X128X90 (DRAPE) ×2 IMPLANT
DRAPE SURG 17X23 STRL (DRAPES) ×2 IMPLANT
GAUZE XEROFORM 1X8 LF (GAUZE/BANDAGES/DRESSINGS) ×2 IMPLANT
GLOVE BIO SURGEON STRL SZ 6.5 (GLOVE) ×2 IMPLANT
GLOVE BIOGEL PI IND STRL 7.0 (GLOVE) ×1 IMPLANT
GLOVE BIOGEL PI IND STRL 8.5 (GLOVE) ×1 IMPLANT
GLOVE BIOGEL PI INDICATOR 7.0 (GLOVE) ×1
GLOVE BIOGEL PI INDICATOR 8.5 (GLOVE) ×1
GLOVE ECLIPSE 6.5 STRL STRAW (GLOVE) ×2 IMPLANT
GLOVE SURG ORTHO 8.0 STRL STRW (GLOVE) ×2 IMPLANT
GOWN BRE IMP PREV XXLGXLNG (GOWN DISPOSABLE) ×2 IMPLANT
GOWN PREVENTION PLUS XLARGE (GOWN DISPOSABLE) ×2 IMPLANT
NDL SAFETY ECLIPSE 18X1.5 (NEEDLE) ×1 IMPLANT
NEEDLE 27GAX1X1/2 (NEEDLE) ×2 IMPLANT
NEEDLE HYPO 18GX1.5 SHARP (NEEDLE) ×1
NS IRRIG 1000ML POUR BTL (IV SOLUTION) ×2 IMPLANT
PACK BASIN DAY SURGERY FS (CUSTOM PROCEDURE TRAY) ×2 IMPLANT
PAD CAST 3X4 CTTN HI CHSV (CAST SUPPLIES) IMPLANT
PADDING CAST ABS 3INX4YD NS (CAST SUPPLIES)
PADDING CAST ABS 4INX4YD NS (CAST SUPPLIES) ×1
PADDING CAST ABS COTTON 3X4 (CAST SUPPLIES) IMPLANT
PADDING CAST ABS COTTON 4X4 ST (CAST SUPPLIES) ×1 IMPLANT
PADDING CAST COTTON 3X4 STRL (CAST SUPPLIES)
SPLINT FINGER 5/8X3.25 (SOFTGOODS) ×1 IMPLANT
SPLINT FINGER FOAM 3 9119 05 (SOFTGOODS) ×2
SPLINT PLASTER CAST XFAST 3X15 (CAST SUPPLIES) IMPLANT
SPLINT PLASTER XTRA FASTSET 3X (CAST SUPPLIES)
SPONGE GAUZE 4X4 12PLY (GAUZE/BANDAGES/DRESSINGS) ×2 IMPLANT
STOCKINETTE 4X48 STRL (DRAPES) ×2 IMPLANT
SUT VIC AB 4-0 P2 18 (SUTURE) IMPLANT
SUT VICRYL RAPID 5 0 P 3 (SUTURE) ×2 IMPLANT
SUT VICRYL RAPIDE 4/0 PS 2 (SUTURE) IMPLANT
SYR BULB 3OZ (MISCELLANEOUS) ×2 IMPLANT
SYR CONTROL 10ML LL (SYRINGE) IMPLANT
TOWEL OR 17X24 6PK STRL BLUE (TOWEL DISPOSABLE) ×4 IMPLANT
UNDERPAD 30X30 INCONTINENT (UNDERPADS AND DIAPERS) ×2 IMPLANT

## 2013-03-27 NOTE — H&P (Signed)
Christopher Collins is a 47 year-old left-hand dominant male who  has had a mucoid cyst rupture on the right middle finger distal interphalangeal joint.  This has been present for several months.  It has been drained several times.  He had one on his little finger also drained several times.  He was given Keflex.  The right small finger resolved after two drainages.  He has no history of injuries, no history of diabetes, thyroid problems, arthritis or gout.  There is a family history of diabetes and arthritis.  He was started on the Keflex the end of last week.  He complains of an intermittent, moderate, sharp, throbbing pain with a feeling of swelling. He complains of PIP and DIP joints of all fingers both hands.    ALLERGIES:     None.  MEDICATIONS:     Cephalexin, Lisinopril, amlodipine besylate, lamotrigine, fluoxetine HCL and   Ambien.   SURGICAL HISTORY:     Cholecystectomy and tonsillectomy.  FAMILY MEDICAL HISTORY:      Positive for diabetes, heart disease, high blood pressure and arthritis.    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   REVIEW OF SYSTEMS:     Positive for glasses, ringing in his ears, high blood pressure, depression and sleep disorder, otherwise negative.  Christopher Collins is an 47 y.o. male.   Chief Complaint: mucoid tumor rt middle finger HPI: see above  Past Medical History  Diagnosis Date  . Depression   . Arthritis     hands  . Sleep apnea with use of continuous positive airway pressure (CPAP)   . Hypertension     under control with meds., has been on med. x 2 yr.  . Bipolar disorder     states mild  . 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 Dental crown present     also dental cap - lower  . Abrasion of finger of right hand 03/21/2013    middle finger    Past Surgical History  Procedure Laterality Date  . Cholecystectomy   2000s  . Tonsillectomy and adenoidectomy      as a child    History reviewed. No pertinent family history. Social History:  reports that he has never smoked. He has never used smokeless tobacco. He reports that  drinks alcohol. He reports that he does not use illicit drugs.  Allergies:  Allergies  Allergen Reactions  . Gluten Meal Other (See Comments)    GI UPSET  . Milk-Related Compounds Other (See Comments)    GI UPSET    No prescriptions prior to admission    No results found for this or any previous visit (from the past 48 hour(s)).  No results found.   Pertinent items are noted in HPI.  Height 6' (1.829 m), weight 103.42 kg (228 lb).  General appearance: alert, cooperative and appears stated age Head: Normocephalic, without obvious abnormality Neck: no carotid bruit and no JVD Resp: clear to auscultation bilaterally Cardio: regular rate and rhythm, S1, S2 normal, no murmur, click, rub or gallop GI: soft, non-tender; bowel sounds normal; no masses,  no organomegaly Extremities: extremities normal, atraumatic, no cyanosis or edema Pulses: 2+ and symmetric Skin: Skin color, texture, turgor normal. No rashes or lesions Neurologic: Grossly normal Incision/Wound: na  Assessment/Plan X-rays of his hand reveal PIP DIP  arthritis all fingers.    DIAGNOSIS:    Degenerative arthritis, cervical spondylosis, PIP/DIP arthritis with mucoid cyst right middle finger.  Transillumination reveals the cyst is present. We have discussed with him the possibility of excision and debridement of the joint right middle finger. The pre, peri and post op course are discussed along with risks and complications.  He is aware there is no guarantee with surgery, possibility of infection, recurrence, injury to arteries, nerves and tendons, incomplete relief of symptoms and dystrophy.  He is scheduled for excision mucoid cyst, debridement DIP joint right middle finger as an outpatient under regional  anesthesia. Questions were invited and answered to the patient's satisfaction.     Aubrianna Orchard R 03/27/2013, 8:01 AM

## 2013-03-27 NOTE — Op Note (Signed)
Dictation Number [id number]

## 2013-03-27 NOTE — Anesthesia Postprocedure Evaluation (Signed)
Anesthesia Post Note  Patient: Christopher Collins  Procedure(s) Performed: Procedure(s) (LRB): EXCISION MUCOID RIGHT MIDDLE FINGER, DEBRIDEMENT DISTAL INTERPHALAGEAL RIGHT MIDDLE FINGER (Right)  Anesthesia type: general  Patient location: PACU  Post pain: Pain level controlled  Post assessment: Patient's Cardiovascular Status Stable  Last Vitals:  Filed Vitals:   03/27/13 1345  BP: 127/78  Pulse: 55  Temp: 36.8 C  Resp: 16    Post vital signs: Reviewed and stable  Level of consciousness: sedated  Complications: No apparent anesthesia complications

## 2013-03-27 NOTE — Brief Op Note (Signed)
03/27/2013  12:13 PM  PATIENT:  Christopher Collins  47 y.o. male  PRE-OPERATIVE DIAGNOSIS:  MUCOID TUMOR RIGHT MIDDLE FINGER, DEGENERATIVE JOINT DISEASE  POST-OPERATIVE DIAGNOSIS:  MUCOID TUMOR RIGHT MIDDLE FINGER,   PROCEDURE:  Procedure(s): EXCISION MUCOID RIGHT MIDDLE FINGER, DEBRIDEMENT DISTAL INTERPHALAGEAL RIGHT MIDDLE FINGER (Right)  SURGEON:  Surgeon(s) and Role:    * Nicki Reaper, MD - Primary  PHYSICIAN ASSISTANT:   ASSISTANTS: none   ANESTHESIA:   local and regional  EBL:     BLOOD ADMINISTERED:none  DRAINS: none   LOCAL MEDICATIONS USED:  MARCAINE     SPECIMEN:  Excision  DISPOSITION OF SPECIMEN:  PATHOLOGY  COUNTS:  YES  TOURNIQUET:   Total Tourniquet Time Documented: Forearm (Right) - 23 minutes Total: Forearm (Right) - 23 minutes   DICTATION: .Other Dictation: Dictation Number [id number]  PLAN OF CARE: Discharge to home after PACU  PATIENT DISPOSITION:  PACU - hemodynamically stable.

## 2013-03-27 NOTE — Transfer of Care (Signed)
Immediate Anesthesia Transfer of Care Note  Patient: Christopher Collins  Procedure(s) Performed: Procedure(s): EXCISION MUCOID RIGHT MIDDLE FINGER, DEBRIDEMENT DISTAL INTERPHALAGEAL RIGHT MIDDLE FINGER (Right)  Patient Location: PACU  Anesthesia Type:Bier block  Level of Consciousness: awake, oriented and patient cooperative  Airway & Oxygen Therapy: Patient Spontanous Breathing and Patient connected to face mask oxygen  Post-op Assessment: Report given to PACU RN and Post -op Vital signs reviewed and stable  Post vital signs: Reviewed and stable  Complications: No apparent anesthesia complications

## 2013-03-27 NOTE — Anesthesia Preprocedure Evaluation (Signed)
Anesthesia Evaluation  Patient identified by MRN, date of birth, ID band Patient awake    Reviewed: Allergy & Precautions, H&P , NPO status   Airway Mallampati: I TM Distance: >3 FB Neck ROM: Full    Dental   Pulmonary sleep apnea and Continuous Positive Airway Pressure Ventilation ,          Cardiovascular hypertension, Pt. on medications     Neuro/Psych Depression    GI/Hepatic   Endo/Other    Renal/GU      Musculoskeletal   Abdominal   Peds  Hematology   Anesthesia Other Findings   Reproductive/Obstetrics                           Anesthesia Physical Anesthesia Plan  ASA: III  Anesthesia Plan: Bier Block   Post-op Pain Management:    Induction: Intravenous  Airway Management Planned: Simple Face Mask  Additional Equipment:   Intra-op Plan:   Post-operative Plan: Extubation in OR  Informed Consent:   Plan Discussed with: CRNA and Surgeon  Anesthesia Plan Comments:         Anesthesia Quick Evaluation

## 2013-03-29 NOTE — Op Note (Signed)
NAME:  AMIRI, RIECHERS        ACCOUNT NO.:  [id number]  MEDICAL RECORD NO.:  [id number]  LOCATION:                               FACILITY:  [facility]  PHYSICIAN:  Cindee Salt, M.D.       DATE OF BIRTH:  [birth year]  DATE OF PROCEDURE:  03/27/2013 DATE OF DISCHARGE:  03/27/2013                              OPERATIVE REPORT   PREOPERATIVE DIAGNOSIS:  Mucoid tumor, degenerative arthritis, right middle finger.  POSTOPERATIVE DIAGNOSIS:  Mucoid tumor, degenerative arthritis, right middle finger.  OPERATION:  Excision of mucoid cyst, debridement of distal interphalangeal joint, right middle finger.  SURGEON:  Cindee Salt, MD  ANESTHESIA:  Forearm-based IV regional with local infiltration metacarpal block.  ANESTHESIOLOGIST:  Kaylyn Layer. Michelle Piper, MD  HISTORY:  The patient is a 47 year old male with a history of recurrent mucoid cyst on distal interphalangeal joint, right middle finger.  He is desirous of having this excised.  Pre, peri, and postoperative course have been discussed along with risks and complications.  He is aware that there is no guarantee with the surgery; possibility of infection; recurrence of injury to arteries, nerves, tendons, incomplete relief of symptoms, dystrophy.  In the preoperative area, the patient was seen, the extremity marked by both the patient and surgeon.  Antibiotic given.  PROCEDURE IN DETAIL:  The patient was brought to the operating room where a forearm-based IV regional anesthetic was carried out without difficulty.  He was prepped using ChloraPrep, supine position with the right arm free.  A 3-minute dry time was allowed.  Time-out taken, confirming the patient and procedure.  A curvilinear incision was made over the distal interphalangeal joint, right middle finger, carried down through subcutaneous tissue.  Bleeders were electrocauterized with bipolar.  A small cyst was immediately encountered at the radial aspect, this was excised sent to  Pathology.  The joint was opened.  A debridement of the distal interphalangeal joint, synovectomy performed. A small incision made on the ulnar aspect, the cyst was not present there.  No further lesions were identified.  The wound was copiously irrigated with saline.  The skin was then closed with interrupted 4-0 Vicryl Rapide sutures.  A metacarpal block was then given with 0.25% Marcaine without epinephrine, 7 mL was used.  Sterile compressive dressing and splint to the finger applied. On deflation of the tourniquet, the remaining fingers pinked.  He was taken to the recovery room for observation in satisfactory condition. He will be discharged home to return to the [facility] [place] in 1 week on Norco.          ______________________________ Cindee Salt, M.D.     GK/MEDQ  D:  03/27/2013  T:  03/28/2013  Job:  [id number]

## 2013-04-17 ENCOUNTER — Ambulatory Visit (INDEPENDENT_AMBULATORY_CARE_PROVIDER_SITE_OTHER): Payer: BC Managed Care – PPO | Admitting: Neurology

## 2013-04-17 ENCOUNTER — Encounter: Payer: Self-pay | Admitting: Neurology

## 2013-04-17 VITALS — BP 130/76 | HR 66 | Resp 16 | Ht 73.0 in | Wt 231.0 lb

## 2013-04-17 DIAGNOSIS — G4700 Insomnia, unspecified: Secondary | ICD-10-CM

## 2013-04-17 DIAGNOSIS — G4730 Sleep apnea, unspecified: Secondary | ICD-10-CM

## 2013-04-17 DIAGNOSIS — G4733 Obstructive sleep apnea (adult) (pediatric): Secondary | ICD-10-CM

## 2013-04-17 DIAGNOSIS — G4710 Hypersomnia, unspecified: Secondary | ICD-10-CM | POA: Insufficient documentation

## 2013-04-17 MED ORDER — ARMODAFINIL 250 MG PO TABS: 250.0000 mg | ORAL_TABLET | Freq: Every day | ORAL | Status: DC

## 2013-04-17 MED ORDER — ZOLPIDEM TARTRATE 10 MG PO TABS: 10.0000 mg | ORAL_TABLET | Freq: Every evening | ORAL | Status: DC | PRN

## 2013-04-17 NOTE — Patient Instructions (Signed)
Insomnia , CPAP back to 8 cm and modafinil started.

## 2013-04-17 NOTE — Progress Notes (Signed)
Chief Complaint  Patient presents with  . Follow-up    OSA, rm 10   A revisit on 04-17-2013. For followup on CPAP compliance and hypersomnia. The patient still endorsed Epworth sleepiness score today at 12 points he underwent surgery on 03-27-13 and had a cyst removed from his middle finger of the right hand, his dominant hand.   He has in the interval time since his last visit with me on 02-06-13 undergone surgery and had a interesting experience. He was apparently snoring loudly before he woke up from his procedure, he did not get a report as to the apnea. He was under conscious sedation for procedure of about 25 minutes duration and when he came back to felt very much refreshed and actually felt like he would like to feel after a night of sleep. He has continued to use the CPAP at the settings of 9 cm water with a 3 cm EPR and has a higher compliance record. Today's in office download covered the time from 519 2 04/16/2013. Indal 75 days his average residual AHI was 4.5 his daily usage 6 hours 12 minutes and he had no major air leaks.  The patient still had many frustrating nights with CPAP mainly because he feels no effect on his daytime sleepiness or fatigue. He had spoken to Dr. Rivka Safer who questioned if he would be a candidate for modafinil or for cerebral. I think he could be a candidate for both. He has remained hypersonic even slightly higher than on the sleep study and orbital on the original would both be medications allowing him to have a better productive day and are not likely to cause insomnia at night. He has been using Ambien at 5 mg as marginal success but responds well to 10 mg. I would rather continue with the Ambien and start Seroquel given the long time side effect profile of either drug.  His data  Down load averaged al days , those with and without use of CPAP , and doubts the correct AHI- he asked if he could be a candidate for adapt SV or another modality - given that he  has a complex apnea component.   i will ask for a re-titration on BiPAP or adapt SV , if this is not reveal benefit , I will defer to depression as a possible non organic  factor in his sleepiness. He has tolerated the CPAP rather well, his insomnia is not related to CPAP he stated.  Again , he has less nocturia and no snoring on the machine , which is a benefit , he cannot find a reason for the increasing Epworth score of 12 .     Assessment and plan   plan B )Titrating on BiPAP to see  if the central apnea can be better addressed , Plan A)  start Modafinil 200 mg in AM, Ambien prn at 10 mg day.    Vision Screening:  Physical exam:  General: The patient is awake, alert and appears not in acute distress. The patient is well groomed.  Head: Normocephalic, atraumatic. Neck is supple. Mallampati 3 , neck circumference:17.5 , retrognathia,  No nasal deviation, can breath through either nasion. Allergic rhinitis has not been bothering him recently.  Cardiovascular: Regular rate and rhythm , without murmurs or carotid bruit, and without distended neck veins.  Respiratory: Lungs are clear to auscultation.  Skin: Without evidence of edema, or rash  Trunk: BMI is elevated and patient has normal posture.  Neurologic exam :  The patient is awake and alert, oriented to place and time. Memory subjective described as intact. There is a normal attention span & concentration ability.  Speech is fluent without dysarthria, dysphonia or aphasia. Mood and affect are appropriate.  Cranial nerves:  Pupils are equal and briskly reactive to light.Extraocular movements in vertical and horizontal planes intact and without nystagmus. Visual fields by finger perimetry are intact.  Hearing to finger rub intact. Facial sensation intact to fine touch. Facial motor strength is symmetric and tongue and uvula move midline.  Motor exam: Normal tone and normal muscle bulk and symmetric normal strength in all extremities.   Sensory: Fine touch, pinprick and vibration were tested as normal.  Coordination: Rapid alternating movements in the fingers/hands is tested and normal. Patient has slight tremor with action.  Gait and station: Patient walks without assistive device and is able and assisted stool climb up to the exam table. Strength within normal limits.Steps are unfragmented.  Deep tendon reflexes: in the upper and lower extremities are symmetric and intact. Babinski maneuver response is downgoing.

## 2013-05-10 DIAGNOSIS — H903 Sensorineural hearing loss, bilateral: Secondary | ICD-10-CM | POA: Insufficient documentation

## 2013-07-22 DIAGNOSIS — M7540 Impingement syndrome of unspecified shoulder: Secondary | ICD-10-CM | POA: Insufficient documentation

## 2013-07-22 DIAGNOSIS — F329 Major depressive disorder, single episode, unspecified: Secondary | ICD-10-CM | POA: Insufficient documentation

## 2013-07-22 DIAGNOSIS — M26609 Unspecified temporomandibular joint disorder, unspecified side: Secondary | ICD-10-CM | POA: Insufficient documentation

## 2013-07-22 DIAGNOSIS — E559 Vitamin D deficiency, unspecified: Secondary | ICD-10-CM | POA: Insufficient documentation

## 2013-08-17 ENCOUNTER — Encounter (HOSPITAL_COMMUNITY): Payer: Self-pay | Admitting: Emergency Medicine

## 2013-08-17 ENCOUNTER — Emergency Department (HOSPITAL_COMMUNITY)
Admission: EM | Admit: 2013-08-17 | Discharge: 2013-08-17 | Disposition: A | Discharge status: Home / Self Care | Payer: BC Managed Care – PPO | Source: Home / Self Care | Attending: Emergency Medicine | Admitting: Emergency Medicine

## 2013-08-17 DIAGNOSIS — H6690 Otitis media, unspecified, unspecified ear: Secondary | ICD-10-CM

## 2013-08-17 DIAGNOSIS — H6691 Otitis media, unspecified, right ear: Secondary | ICD-10-CM

## 2013-08-17 NOTE — ED Provider Notes (Signed)
CSN: [id number]     Arrival date & time 08/17/13  0901 History   First MD Initiated Contact with Patient 08/17/13 [id number]     Chief Complaint  Patient presents with  . Otalgia   (Consider location/radiation/quality/duration/timing/severity/associated sxs/prior Treatment) HPI Comments: 47 year old male presents complaining of ear fullness, intermittent ear pains, muffled hearing in the right ear. In all, this has been going on for a couple of months. He is currently under the care of an otolaryngologist for this problem. He had a myringotomy procedure done 3 weeks ago. His symptoms did not improve after this, and in the last couple of days she has started to have some intermittent pains in the ear. He was given mometasone eardrops. The pain started after he started using the drops so he wants to make sure that nothing is wrong in addition, he has some intermittent nasal congestion and cough for the last 2 weeks. The symptoms are very mild. He has these often and uses daily Nasonex for this problem. No fever, chills, headache, dizziness, lightheadedness, vertigo, NVD, blurry vision, ear discharge.  Patient is a 47 y.o. male presenting with ear pain.  Otalgia Associated symptoms: hearing loss   Associated symptoms: no abdominal pain, no cough, no diarrhea, no fever, no neck pain, no rash, no sore throat and no vomiting     Past Medical History  Diagnosis Date  . Depression   . Arthritis     hands  . Sleep apnea with use of continuous positive airway pressure (CPAP)   . Hypertension     under control with meds., has been on med. x 2 yr.  . Bipolar disorder     states mild  . 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 Dental crown present     also dental cap - lower  . Abrasion of finger of right hand 03/21/2013    middle finger   Past Surgical History  Procedure Laterality Date  . Cholecystectomy  2000s  . Tonsillectomy  and adenoidectomy      as a child  . Finger surgery Right 03/2013    right middle finger-cyst removed   No family history on file. History  Substance Use Topics  . Smoking status: Never Smoker   . Smokeless tobacco: Never Used  . Alcohol Use: Yes     Comment: occasionally    Review of Systems  Constitutional: Negative for fever, chills and fatigue.  HENT: Positive for ear pain and hearing loss. Negative for sore throat.   Eyes: Negative for visual disturbance.  Respiratory: Negative for cough and shortness of breath.   Cardiovascular: Negative for chest pain, palpitations and leg swelling.  Gastrointestinal: Negative for nausea, vomiting, abdominal pain, diarrhea and constipation.  Genitourinary: Negative for dysuria, urgency, frequency and hematuria.  Musculoskeletal: Negative for arthralgias, myalgias, neck pain and neck stiffness.  Skin: Negative for rash.  Neurological: Negative for dizziness, weakness and light-headedness.    Allergies  Gluten meal and Milk-related compounds  Home Medications   Current Outpatient Rx  Name  Route  Sig  Dispense  Refill  . lamoTRIgine (LAMICTAL) 100 MG tablet   Oral   Take 100 mg by mouth daily.         Marland Kitchen lisinopril (PRINIVIL,ZESTRIL) 10 MG tablet   Oral   Take 10 mg by mouth daily.         . mometasone (ELOCON)  0.1 % cream   Topical   Apply 1 application topically daily.         Marland Kitchen amLODipine (NORVASC) 5 MG tablet   Oral   Take 5 mg by mouth daily.         . Armodafinil (NUVIGIL) 250 MG tablet   Oral   Take 1 tablet (250 mg total) by mouth daily.   30 tablet   2   . Cholecalciferol (VITAMIN D3) 5000 UNITS CAPS   Oral   Take 10,000 Int'l Units by mouth daily.          Marland Kitchen FLUoxetine (PROZAC) 20 MG tablet   Oral   Take 20 mg by mouth daily.         Marland Kitchen HYDROcodone-acetaminophen (NORCO) 5-325 MG per tablet   Oral   Take 1 tablet by mouth every 6 (six) hours as needed for pain.   30 tablet   0   .  Methylcobalamin 1 MG CHEW   Oral   Chew by mouth.         . OMEGA 3 1200 MG CAPS   Oral   Take 2,400 mg by mouth daily.          . Pregnenolone POWD   Oral   Take 100 mg by mouth daily.          Marland Kitchen pyridOXINE (VITAMIN B-6) 50 MG tablet   Oral   Take 50 mg by mouth daily.         Marland Kitchen zolpidem (AMBIEN) 10 MG tablet   Oral   Take 1 tablet (10 mg total) by mouth at bedtime as needed for sleep.   30 tablet   2    BP 128/87  Pulse 69  Temp(Src) 97.8 F (36.6 C) (Oral)  Resp 18  SpO2 96% Physical Exam  Nursing note and vitals reviewed. Constitutional: He is oriented to person, place, and time. He appears well-developed and well-nourished. No distress.  HENT:  Head: Normocephalic and atraumatic.  Right Ear: Hearing, external ear and ear canal normal. Right ear exhibits lacerations (small incision through the tympanic membrane at the 6:00 position, appears to be healing appropriately). No drainage or swelling. Tympanic membrane is scarred. Tympanic membrane is not erythematous and not bulging. No middle ear effusion.  Left Ear: Hearing, tympanic membrane, external ear and ear canal normal.  Mouth/Throat: Oropharynx is clear and moist. No oropharyngeal exudate.  Eyes: Conjunctivae and EOM are normal. Pupils are equal, round, and reactive to light.  Neck: Normal range of motion. Neck supple.  Pulmonary/Chest: Effort normal. No respiratory distress.  Lymphadenopathy:    He has no cervical adenopathy.  Neurological: He is alert and oriented to person, place, and time. No cranial nerve deficit. Coordination normal.  Skin: Skin is warm and dry. No rash noted. He is not diaphoretic.  Psychiatric: He has a normal mood and affect. Judgment normal.    ED Course  Procedures (including critical care time) Labs Review Labs Reviewed - No data to display Imaging Review No results found.    MDM   1. Otitis, right    It does not seem that these symptoms are worse and there is  no obvious infection. The ear pain is intermittent and mild, and the rest of his symptoms are simply not better since seeing both his primary care physician an otolaryngologist. He needs to followup with the otolaryngologist. Advised that he may try using ibuprofen or Aleve to help with the sensation of ear fullness  which may aid in proper physiologic eustachian tube drainage or mild otitis    Graylon Good, PA-C 08/17/13 [phone]

## 2013-08-17 NOTE — ED Provider Notes (Signed)
Medical screening examination/treatment/procedure(s) were performed by non-physician practitioner and as supervising physician I was immediately available for consultation/collaboration.  Shoshana Johal, M.D.  Temeka Pore C Meliss Fleek, MD 08/17/13 2059 

## 2013-08-17 NOTE — ED Notes (Addendum)
Pt c/o right ear pain/fullness onset 3 days... Had a myringotomy procedure done about 3 weeks ago Given Mometasone 0.1% on both ears Sxs also include: decreased hearing, congestion He saw his PCP on Tuesday for the same reason Denies: f/v/n/d Alert w/no signs of acute distress.

## 2013-08-28 ENCOUNTER — Ambulatory Visit (INDEPENDENT_AMBULATORY_CARE_PROVIDER_SITE_OTHER): Payer: BC Managed Care – PPO | Admitting: Neurology

## 2013-08-28 ENCOUNTER — Encounter: Payer: Self-pay | Admitting: Neurology

## 2013-08-28 VITALS — BP 125/81 | HR 68 | Resp 16 | Ht 73.0 in | Wt 230.0 lb

## 2013-08-28 DIAGNOSIS — G4733 Obstructive sleep apnea (adult) (pediatric): Secondary | ICD-10-CM

## 2013-08-28 DIAGNOSIS — G4710 Hypersomnia, unspecified: Secondary | ICD-10-CM

## 2013-08-28 NOTE — Progress Notes (Signed)
Chief Complaint  Patient presents with  . Sleep Apnea    3 mo f/u/CPAP card was downloaded in clinic durring this visit   A revisit on  08-28-2013.  In all last visit with the patient we have discussed the visit meaning hypersomnia and degree of hypersomnia as being excessive in comparison to the valve treated sleep apnea. Today again I was able to review of the CPAP compliance.  The patient has used  CPAP over the last  94 days his machine in average time of 7 hours and 51 minutes nightly/  his residual AHI is 2.5, the machine is set at 8 cm water the 3 cm EPR. Air leak is well below average. He is 94% compliant with CPAP use. His Epworth score remained at 14 points, FSS at  58 points. The patient still uses Klonopin for his diagnostic diagnosed anxiety disorder and it helps his nocturnal sleep sometimes as well but it does create some daytime hypersomnia. In our last visit I had prescribed Nuvigil at 250 mg  In the morning by mouth. The patient had taken single Dose of 200 mg modafinil  In AM and responded with an excessive anxiety or panic attack.  The next day he tried a half caplet of 125 mg,  but his response was still  The excessive anxiety, perhaps lasting from the previous day. So he has not made an attempt to return to her modafinil at this point and he still feels somewhat more anxious than when he saw me last- prior to the medication being initiated. When he started on Remeron he had some nights with vivid dreams, but no noted dream intrusion, sleep paralysis, cataplexy , nightmares, sleep walking, -talking, and no reported REM BD.    Last visit August 2014:  For follow up on CPAP compliance and hypersomnia. The patient still endorsed Epworth sleepiness score today at 12 points.  He underwent surgery on 03-27-13 and had a cyst removed from his middle finger of the right hand, his dominant hand. He has in the interval time since his last visit with me on 02-06-13 undergone surgery and had a  interesting experience. He was apparently snoring loudly before he woke up from his procedure, he did not get a report as to the apnea.  He was under conscious sedation for  The procedure of about 25 minutes duration and when he came back to felt very much refreshed and actually felt like he would like to feel after a night of sleep.  He has continued to use the CPAP at the settings of 9 cm water with a 3 cm EPR and has a high compliance record.   Today's in office download covered the time from  5-19 through 04/16/2013. - 75 days,  his average residual AHI was 4.5 , his daily usage 6 hours 12 minutes and he had no major air leaks. The patient still had many frustrating nights with CPAP mainly because he feels no effect on his daytime sleepiness or fatigue. He had spoken to Dr. Rivka Safer who questioned if he would be a candidate for modafinil or for cerebral. I think he could be a candidate for both. He has remained hypersomnic even slightly higher since sleep study , craving to have a productive day and not  to cause insomnia at night.     Dr Nolen Mu following him.     ROS in 14 systems :  The patient endorsed a day hearing loss ear pain and tinnitus ringing  in his ear is on both sides, no tobacco use no alcohol use and no recreational drug use was endorsed. The patient still endorsed apnea also this has been treated he has no breakthrough snoring there is no sleep talking sleep walking or acting out dreams that was noticed he is also not in stomach but it comes to sleep initiation he feels that he has had a current for the worse with his anxiety and panic disorder and depression. CPAP through [facility] as DME.     Physical examination:   Vision Screening:  Physical exam:  General: The patient is awake, alert and appears not in acute distress. The patient is well groomed.  Head: Normocephalic, atraumatic. Neck is supple. Mallampati 3 , neck circumference:17.5 , retrognathia,  No nasal  deviation, can breath through either nasion. Allergic rhinitis has not been bothering him recently.  Cardiovascular: Regular rate and rhythm , without murmurs or carotid bruit, and without distended neck veins.  Respiratory: Lungs are clear to auscultation.  Skin: Without evidence of edema, or rash  Trunk: BMI is elevated and patient has normal posture.  Neurologic exam :  The patient is awake and alert, oriented to place and time. Memory subjective described as intact. There is a normal attention span & concentration ability.  Speech is fluent without dysarthria, dysphonia or aphasia. Mood and affect are anxious  and depressed.  Cranial nerves:  Pupils are equal and briskly reactive to light. Hearing to finger rub intact. Facial sensation intact to fine touch.  Facial motor strength is symmetric and tongue and uvula move midline.  Motor exam: Normal tone and normal muscle bulk and symmetric normal strength in all extremities.  Sensory: Fine touch, pinprick and vibration were tested as normal.  Coordination: Rapid alternating movements in the fingers/hands - Patient has slight tremor with action.  Gait and station: Patient walks without assistive device.  Deep tendon reflexes: in the upper and lower extremities are symmetric and intact.     The patient remains excessively fatigued and has a higher than average daytime sleepiness degrees. Also has CPAP has treated his obstructive sleep apnea completely he has nor significant residual apnea activity and he is 94% compliant. It is therefore safe to presume that his apnea has nothing at this point to do with his residual sleepiness. The to be no assessment or resetting of his current pressure and no change in the mask no resetting. He is using CPAP at 8 cm water with 3 cm EPR.  Modafinil was started in August to treat the excessive daytime sleepiness and fatigue I had initially ordered 200 mg generic modafinil in a.am. discharge are to have had a  detrimental effect on the patient especially exacerbation of anxiety. Experience left and panicked and disturbed.  At this time  He is not interested in trying any other stimulant medication and  not the same and lower dose either. 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    Tirzah Fross, MD   cc Andee Poles, MD          Shaune Pollack, MD

## 2013-08-28 NOTE — Patient Instructions (Signed)

## 2013-09-02 ENCOUNTER — Encounter: Payer: Self-pay | Admitting: Neurology

## 2014-02-27 ENCOUNTER — Encounter: Payer: Self-pay | Admitting: Sports Medicine

## 2014-02-27 ENCOUNTER — Ambulatory Visit (INDEPENDENT_AMBULATORY_CARE_PROVIDER_SITE_OTHER): Payer: BC Managed Care – PPO | Admitting: Sports Medicine

## 2014-02-27 VITALS — BP 132/85 | Ht 72.0 in | Wt 225.0 lb

## 2014-02-27 DIAGNOSIS — M25562 Pain in left knee: Secondary | ICD-10-CM

## 2014-02-27 DIAGNOSIS — M25569 Pain in unspecified knee: Secondary | ICD-10-CM

## 2014-02-27 NOTE — Progress Notes (Signed)
Patient ID: Christopher Collins, male   DOB: 01/06/1966, 48 y.o.   MRN: [id number] 47 yo male with a complaint of left knee pain. Onset approximately 2 weeks ago. Started after doing some home repair work in his utility room. He was doing a lot of stooping squatting as well as stepping up on one leg. Describes an achy sensation and stiffness in his knee.  No significant swelling noted. No sharp pain. No catching or locking. Discomfort is more noted when going up or down stairs. No prior knee injuries. Has not taken anything to relieve the symptoms.  Pertinent past medical history: OSA, hypertension  Social history: Former avid runner, nonsmoker  Review of systems: As per history of present illness otherwise all systems negative  Examination: BP 132/85  Ht 6' (1.829 m)  Wt 225 lb (102.059 kg)  BMI 30.51 kg/m2 Well-developed well-nourished 48 year old male awake alert and oriented in no acute distress  Left Knee: Normal to inspection with no erythema or effusion or obvious bony abnormalities. Palpation with mild joint line TTP on med and lat joint line ROM normal in flexion and extension and lower leg rotation. Ligaments with solid consistent endpoints including ACL, PCL, LCL, MCL. Negative Mcmurray's and provocative meniscal tests. Non painful patellar compression. Patellar and quadriceps tendons unremarkable. Hamstring and quadriceps strength is normal.  Examination the right knee is unremarkable  Neurovascularly intact with equal pulses in his bilateral lower extremities  Muscular skeletal ultrasound of the left knee reveals a mild evidence of effusion in the suprapatellar pouch. No obvious meniscal pathology. Mild bone spurring of the medial femoral trochlea with narrowing of the patellofemoral space normal-appearing lateral patellofemoral space

## 2014-02-27 NOTE — Assessment & Plan Note (Signed)
This may be related to mild degenerative changes within the knee. We recommended a body helix compression sleeve. In addition quadriceps strengthening exercises. He'll followup in 4-6 weeks' time.

## 2014-03-26 ENCOUNTER — Ambulatory Visit: Payer: BC Managed Care – PPO | Admitting: Sports Medicine

## 2014-03-27 ENCOUNTER — Encounter: Payer: Self-pay | Admitting: Sports Medicine

## 2014-03-27 ENCOUNTER — Ambulatory Visit (INDEPENDENT_AMBULATORY_CARE_PROVIDER_SITE_OTHER): Payer: BC Managed Care – PPO | Admitting: Sports Medicine

## 2014-03-27 VITALS — BP 127/87 | HR 64 | Ht 72.0 in | Wt 225.0 lb

## 2014-03-27 DIAGNOSIS — G5751 Tarsal tunnel syndrome, right lower limb: Secondary | ICD-10-CM

## 2014-03-27 DIAGNOSIS — M25571 Pain in right ankle and joints of right foot: Secondary | ICD-10-CM

## 2014-03-27 DIAGNOSIS — G5750 Tarsal tunnel syndrome, unspecified lower limb: Secondary | ICD-10-CM

## 2014-03-27 DIAGNOSIS — M25569 Pain in unspecified knee: Secondary | ICD-10-CM

## 2014-03-27 DIAGNOSIS — M25579 Pain in unspecified ankle and joints of unspecified foot: Secondary | ICD-10-CM

## 2014-03-27 DIAGNOSIS — M25562 Pain in left knee: Secondary | ICD-10-CM

## 2014-03-27 NOTE — Assessment & Plan Note (Signed)
Pain is secondary to underlying OA. Patient to continue using body helix. PRN NSAID's if needed. Continue quad strengthening exercises. Resume normal activities.

## 2014-03-27 NOTE — Patient Instructions (Signed)
Continue using the body helix.  You can used NSAID's as needed for pain (Aleve, Ibuprofen)  Resume your normal physical activity.  If you continue to have tarsal tunnel pain, we can alleviate this with orthotics. Followup if desired.

## 2014-03-27 NOTE — Progress Notes (Signed)
  Christopher Collins - 48 y.o. male MRN [id number]  Date of birth: [birth year]  SUBJECTIVE:     48 year old male presents for follow up of left knee pain.  Patient was seen previously on 6/18 for left knee pain.  Ultrasound was done at that time and revealed degenerative changes; it did not show any evidence of meniscal tear.  He was given quad strengthening exercises and given a body helix sleeve.  Today he presents for followup. He reports that he is still having intermittent left knee "discomfort" particularly with physical activity.  He reports compliance with body helix and states that it does help. He is not currently taking any medications for this is pain/discomfort is okay.  He denies any associated popping, locking, clicking.  ROS:     Per HPI  PMH - Reviewed. Significant for OSA.  Social Hx - Nonsmoker. Former runner  OBJECTIVE: BP 127/87  Pulse 64  Ht 6' (1.829 m)  Wt 225 lb (102.059 kg)  BMI 30.51 kg/m2  Physical Exam:  Vital signs are reviewed. General: well appearing gentleman in NAD.' MSK Knee: Inspection -  no erythema or effusion. Prominent tibial tubercles with PMH of osgood schlatter. Palpation normal with no warmth, joint line tenderness, patellar tenderness, or condyle tenderness. ROM - symmetrical lacking of a few degrees of full extension and flexion. Ligaments with solid consistent endpoints including ACL, PCL, LCL, MCL. Negative Mcmurray's. Non painful patellar compression. Patellar glide without crepitus. Patellar and quadriceps tendons unremarkable. Hamstring and quadriceps strength is normal.   Right foot - Pes cavus noted.   ASSESSMENT & PLAN: See problem based charting & AVS for pt instructions.

## 2014-03-27 NOTE — Assessment & Plan Note (Signed)
Patient reports recent increase in pain of the right foot secondary to underlying tarsal tunnel. Patient to continue vitamin B6. Advised patient to followup for custom orthotics he pain continued to persist or worsens.

## 2014-04-07 ENCOUNTER — Other Ambulatory Visit: Payer: Self-pay | Admitting: Neurology

## 2014-04-07 DIAGNOSIS — G4733 Obstructive sleep apnea (adult) (pediatric): Secondary | ICD-10-CM

## 2014-05-14 DIAGNOSIS — K219 Gastro-esophageal reflux disease without esophagitis: Secondary | ICD-10-CM | POA: Insufficient documentation

## 2014-07-28 ENCOUNTER — Telehealth: Payer: Self-pay | Admitting: Neurology

## 2014-07-28 NOTE — Telephone Encounter (Signed)
Left message for patient regarding rescheduling 08/28/14 appointment per Jeani Hawking leaving, rescheduled to Dr. Edwena Felty first available on 09/15/14.

## 2014-08-28 ENCOUNTER — Ambulatory Visit: Payer: BC Managed Care – PPO | Admitting: Nurse Practitioner

## 2014-09-15 ENCOUNTER — Encounter: Payer: Self-pay | Admitting: Neurology

## 2014-09-15 ENCOUNTER — Ambulatory Visit (INDEPENDENT_AMBULATORY_CARE_PROVIDER_SITE_OTHER): Payer: BC Managed Care – PPO | Admitting: Neurology

## 2014-09-15 VITALS — BP 127/81 | HR 68 | Resp 16 | Ht 73.0 in | Wt 221.0 lb

## 2014-09-15 DIAGNOSIS — Z9989 Dependence on other enabling machines and devices: Secondary | ICD-10-CM

## 2014-09-15 DIAGNOSIS — G4733 Obstructive sleep apnea (adult) (pediatric): Secondary | ICD-10-CM

## 2014-09-15 DIAGNOSIS — F329 Major depressive disorder, single episode, unspecified: Secondary | ICD-10-CM

## 2014-09-15 DIAGNOSIS — R5383 Other fatigue: Secondary | ICD-10-CM

## 2014-09-15 NOTE — Progress Notes (Signed)
Chief Complaint  Patient presents with  . RV sleep cpap    Rm 11, alone    09-15-14 [name] is here for his yearly revisit. I saw him last 13 months ago when hypersomnia and fatigue for his main concerns. The patient has been a compliant CPAP user and it was evident that obstructive sleep apnea is not the main reason for the patient's high level of fatigue. He has done well with his CPAP tolerance and compliance. Today he endorsed the Epworth sleepiness score at 7 points which would be an normal range, but his fatigue severity score is at 54 points. This is still somewhat concerning. The patient documented on his software program that he had night sweats without nearly any apnea and then periods were his AHI reached 10 and above. It seems that apneas are clustered in the last 2 hours of sleep mostly around 6 and 7 AM. And these are likely REM dependent apneas. His apnea index here today was 4.2 for the last 30 days him the AHI was 1.1 he uses a machine 9 hours and 8 minutes on average at night 100% compliance for days of use and days of 4 hours of use 100% compliance over the last year. His AHI is 5.3 , the machine is set at 8 cm water with 3 cm EPR.  He v could not tolerate Nuvigil, became hypervigilant and anxious.   Why is he so fatigued ?       08-28-2013.  In all last visit with the patient we have discussed the visit meaning hypersomnia and degree of hypersomnia as being excessive in comparison to the valve treated sleep apnea. Today again I was able to review of the CPAP compliance.  The patient has used  CPAP over the last  94 days his machine in average time of 7 hours and 51 minutes nightly/  his residual AHI is 2.5, the machine is set at 8 cm water the 3 cm EPR. Air leak is well below average. He is 94% compliant with CPAP use. His Epworth score remained at 14 points, FSS at  58 points.  The patient still uses Klonopin for his diagnostic diagnosed anxiety disorder and it  helps his nocturnal sleep sometimes as well but it does create some daytime hypersomnia. In our last visit I had prescribed Nuvigil at 250 mg  In the morning by mouth. The patient had taken single Dose of 200 mg modafinil  In AM and responded with an excessive anxiety or panic attack.  The next day he tried a half caplet of 125 mg,  but his response was still  The excessive anxiety, perhaps lasting from the previous day. So he has not made an attempt to return to her modafinil at this point and he still feels somewhat more anxious than when he saw me last- prior to the medication being initiated. When he started on Remeron he had some nights with vivid dreams, but no noted dream intrusion, sleep paralysis, cataplexy , nightmares, sleep walking, -talking, and no reported REM BD.    Sleep  visit August 2014:  For follow up on CPAP compliance and hypersomnia. The patient still endorsed Epworth sleepiness score today at 12 points.  He underwent surgery on 03-27-13 and had a cyst removed from his middle finger of the right hand, his dominant hand. He has in the interval time since his last visit with me on 02-06-13 undergone surgery and had a interesting experience. He was apparently  snoring loudly before he woke up from his procedure, he did not get a report as to the apnea.  He was under conscious sedation for  The procedure of about 25 minutes duration and when he came back to felt very much refreshed and actually felt like he would like to feel after a night of sleep.  He has continued to use the CPAP at the settings of 9 cm water with a 3 cm EPR and has a high compliance record.   Today's in office download covered the time from  5-19 through 04/16/2013. -  75 days,  his average residual AHI was 4.5 , his daily usage 6 hours 12 minutes and he had no major air leaks. The patient still had many frustrating nights with CPAP - mainly because he feels no effect on his daytime sleepiness or fatigue. He had  spoken to Dr. Alton Revere,  who questioned if he would be a candidate for modafinil or XYREM . I think he could be a candidate for both. He has remained hypersomnic even slightly higher since sleep study , craving to have a productive day and not  to cause insomnia at night.    ROS in 14 systems :  The patient endorsed a day hearing loss ear pain and tinnitus ringing in his ear is on both sides, no tobacco use no alcohol use and no recreational drug use was endorsed. The patient still endorsed apnea also this has been treated he has no breakthrough snoring there is no sleep talking sleep walking or acting out dreams that was noticed he is also not in stomach but it comes to sleep initiation he feels that he has had a current for the worse with his anxiety and panic disorder and depression.  CPAP through [facility] as DME.     Physical examination:   Vision Screening:  Physical exam:  General: The patient is awake, alert and appears not in acute distress. The patient is well groomed.  Head: Normocephalic, atraumatic. Neck is supple. Mallampati 3 , neck circumference:17.5 , retrognathia,  No nasal deviation, can breath through either nasion. Allergic rhinitis has not been bothering him recently.  Cardiovascular: Regular rate and rhythm , without murmurs or carotid bruit, and without distended neck veins.  Respiratory: Lungs are clear to auscultation.  Skin: Without evidence of edema, or rash  Trunk: BMI is elevated and patient has normal posture.  Neurologic exam :  The patient is awake and alert, oriented to place and time. Memory subjective described as intact. There is a normal attention span & concentration ability.  Speech is fluent without dysarthria, dysphonia or aphasia. Mood and affect are anxious  and depressed.  Cranial nerves:  Pupils are equal and briskly reactive to light. Hearing to finger rub intact. Facial sensation intact to fine touch.  Facial motor strength is symmetric and  tongue and uvula move midline.    The patient remains excessively fatigued and no longer has excessive  daytime sleepiness , excellent 100% CPAP compliance, he  has treated his obstructive sleep apnea completely . There needs to  be no  resetting and no change in the mask - He is using CPAP at 8 cm water with 3 cm EPR.  I will place him on an auto titration 6-10 cm with 2 cm EPR.  He has mainly central apneas during REM sleep- this may be physiological.  He needs to continue his vit D supplement.   Last physical on Sep 10, 2014 ,  CBC, lipid , CMET all normal.         Manaia Samad, MD   cc Sheralyn Boatman, MD         Darcus Austin, MD

## 2014-09-27 ENCOUNTER — Encounter: Payer: Self-pay | Admitting: Neurology

## 2014-10-13 ENCOUNTER — Encounter: Payer: Self-pay | Admitting: Neurology

## 2015-08-10 ENCOUNTER — Other Ambulatory Visit: Payer: Self-pay | Admitting: Neurology

## 2015-08-10 MED ORDER — MONTELUKAST SODIUM 10 MG PO TABS: 10.0000 mg | ORAL_TABLET | Freq: Every day | ORAL | Status: AC

## 2015-08-21 ENCOUNTER — Other Ambulatory Visit: Payer: Self-pay | Admitting: Gastroenterology

## 2015-08-21 DIAGNOSIS — R7989 Other specified abnormal findings of blood chemistry: Secondary | ICD-10-CM

## 2015-08-21 DIAGNOSIS — R945 Abnormal results of liver function studies: Principal | ICD-10-CM

## 2015-08-24 ENCOUNTER — Ambulatory Visit
Admission: RE | Admit: 2015-08-24 | Discharge: 2015-08-24 | Disposition: A | Discharge status: Home / Self Care | Payer: BC Managed Care – PPO | Source: Ambulatory Visit | Attending: Gastroenterology | Admitting: Gastroenterology

## 2015-08-24 DIAGNOSIS — R7989 Other specified abnormal findings of blood chemistry: Secondary | ICD-10-CM

## 2015-08-24 DIAGNOSIS — R945 Abnormal results of liver function studies: Principal | ICD-10-CM

## 2015-08-24 NOTE — Telephone Encounter (Signed)
This encounter was created in error - please disregard.

## 2015-08-27 ENCOUNTER — Other Ambulatory Visit: Payer: BC Managed Care – PPO

## 2015-09-08 ENCOUNTER — Encounter: Payer: Self-pay | Admitting: Neurology

## 2015-09-16 ENCOUNTER — Ambulatory Visit: Payer: BC Managed Care – PPO | Admitting: Neurology

## 2016-12-26 ENCOUNTER — Ambulatory Visit (INDEPENDENT_AMBULATORY_CARE_PROVIDER_SITE_OTHER): Payer: BC Managed Care – PPO | Admitting: Sports Medicine

## 2016-12-26 ENCOUNTER — Encounter: Payer: Self-pay | Admitting: Sports Medicine

## 2016-12-26 VITALS — BP 143/84 | Ht 72.0 in | Wt 240.0 lb

## 2016-12-26 DIAGNOSIS — M25562 Pain in left knee: Secondary | ICD-10-CM | POA: Diagnosis not present

## 2016-12-26 NOTE — Assessment & Plan Note (Addendum)
-   Acute on chronic. Suspect OA. Small suprapatellar effusion likely not contributing much to pain. - Continue to let pain guide exercise. Elliptical a good alternative to jogging. - Will obtain left knee xray (AP standing, lateral, sunrise) to better characterize degree of suspected arthritis - If little OA seen, could consider MRI to r/o meniscal injury - Continue to wear compression sleeves - Take acetaminophen prn - Consider steroid injection if knee pain becomes more limiting - Will place PT referral, as would like patient to continue to strengthen quads - Recommended leg raise exercises

## 2016-12-26 NOTE — Progress Notes (Signed)
[facility] Progress Note  Subjective:  Christopher Collins is a 51 y.o. male with history of arthritis (right hand and neck), bipolar disorder, HTN, Osgood-Schlatter, and R tarsal tunnel syndrome who presents for left knee pain. He was originally seen by [facility] 02/2014 for left knee pain after lots of squats and bending down doing repair work in his utility room; found to have mild effusion in suprapatellar pouch, mild bone spurring of medial femoral trochlea and narrowing of medial patellofemoral space on [place]. Since then he has had knee pain on and off, usually worse with increasing activity. For the last 3 weeks, pain has increased, as patient began jogging again. He has been trying to lose weight and wanted to increase exercise to help with that. Prior to that, he had been walking for about 1 hour on the treadmill (3.5-4 miles). He has been alternating jogging and walking about 250 m at a time on the track. Denies any popping sensation in the knee but is concerned he is guarding somewhat. His knee has not given out but has had sensation of almost slipping. He does wear a helix compression sleeve and thinks it provides stability. He has been going to PT at [place] with therapist Voncille Lo once a week, focusing on his knee and low back. Wears a shoe insert with metatarsal pad.   ROS: No decreased sensation, no falls. Does report some pain and tingling over R 4th metatarsal but only if driving long distances.  Objective: Blood pressure (!) 143/84, height 6' (1.829 m), weight 240 lb (108.9 kg). Body mass index is 32.55 kg/m. Constitutional: Obese male in NAD Pulmonary/Chest: No respiratory distress.  Abdominal: Soft. Small diastasis recti.   Musculoskeletal: Bilateral knees appear symmetric. No obvious effusion. Feet slightly abducted. No increased warmth. Mild bilateral crepitus. Point tenderness at medial joint line of bilateral knees closer to patella. Full flexion and extension of  knee. 5/5 strength with hip abduction bilaterally and leg extension and flexion. No pain or popping with McMurray's test bilaterally.  Neurological: No focal deficits.  Skin: Skin is warm and dry. No erythema.  Vitals reviewed  [place] L knee: Small effusion  Assessment/Plan: Pain, joint, knee, left - Acute on chronic. Suspect OA. Small suprapatellar effusion likely not contributing much to pain. - Continue to let pain guide exercise. Elliptical a good alternative to jogging. - Will obtain left knee xray (AP standing, lateral, sunrise) to better characterize degree of suspected arthritis - If little OA seen, could consider MRI to r/o meniscal injury - Continue to wear compression sleeves - Take acetaminophen prn - Consider steroid injection if knee pain becomes more limiting - Will place PT referral, as would like patient to continue to strengthen quads - Recommended leg raise exercises  Follow-up prn and pending knee x-ray results.  Olene Floss, MD [place], PGY-2  Patient seen and evaluated with the resident. I agree with the above plan of care. Treatment options were discussed with the patient today. He will continue with physical therapy adding an isometric quad strengthening and hamstring strengthening exercises. Continue with his compression sleeve. He may continue to increase activity as tolerated but is cautioned against pushing through a lot of knee pain with running. We will get x-rays to evaluate the degree of arthritis present in the patient will follow-up with me in a month or so. We did discuss the possibility of a cortisone injection or further diagnostic imaging if his symptoms persist or worsen.

## 2017-01-09 ENCOUNTER — Ambulatory Visit: Payer: BC Managed Care – PPO | Admitting: Sports Medicine

## 2017-02-03 ENCOUNTER — Ambulatory Visit
Admission: RE | Admit: 2017-02-03 | Discharge: 2017-02-03 | Disposition: A | Discharge status: Home / Self Care | Payer: BC Managed Care – PPO | Source: Ambulatory Visit | Attending: Sports Medicine | Admitting: Sports Medicine

## 2017-02-03 DIAGNOSIS — M25562 Pain in left knee: Secondary | ICD-10-CM

## 2017-02-07 ENCOUNTER — Encounter: Payer: Self-pay | Admitting: Sports Medicine

## 2017-02-07 ENCOUNTER — Ambulatory Visit (INDEPENDENT_AMBULATORY_CARE_PROVIDER_SITE_OTHER): Payer: BC Managed Care – PPO | Admitting: Sports Medicine

## 2017-02-07 DIAGNOSIS — M25571 Pain in right ankle and joints of right foot: Secondary | ICD-10-CM | POA: Diagnosis not present

## 2017-02-07 DIAGNOSIS — M25562 Pain in left knee: Secondary | ICD-10-CM | POA: Diagnosis not present

## 2017-02-07 NOTE — Progress Notes (Signed)
   Subjective:    Patient ID: Christopher Collins, male    DOB: [birth year], 51 y.o.   MRN: [id number]  HPI  Christopher Collins comes in today for follow-up on left knee pain. Overall, his symptoms have improved but he admits that he has not been as active as he would like over the past couple weeks just simply due to being busy. He has returned to doing some light walking on the treadmill and this has not been painful. He has not noticed any swelling. No locking of the knee. This is actually a chronic issue for him. He injured his knee in 2015 after jumping over a fence. He describes intermittent episodes of instability since then. He has been doing his home exercises and wearing his body helix compression sleeve.   Review of Systems As above    Objective:   Physical Exam  Well-developed, well-nourished. No acute distress. Awake alert and oriented 3. Vital signs reviewed  Left knee: Full range of motion. No effusion. He is tender to palpation along the medial joint line with a mildly positive medial Thessaly's. Negative McMurray's. There is a slight amount of laxity with Lachman's and anterior drawer testing but no gross instability appreciated. PCL is intact. Knee is stable to valgus and varus stressing. Neurologically intact distally. Walking without a limp.  X-rays of the left knee including AP, lateral, sunrise view, and 30 flexion view show only minimal degenerative changes throughout the knee. Nothing acute  MSK ultrasound of the left knee shows no effusion. Visualized portion of the medial meniscus appears to be intact.      Assessment & Plan:   Left knee pain likely secondary to degenerative medial meniscal tear with possible remote anterior cruciate ligament injury  Overall, the patient's symptoms have improved. He will continue to increase his activity as tolerated. I've explained to him the importance of continuing with his home exercises. I also explained to him the possibility of a  possible anterior cruciate ligament injury in 2015. If that is the case then I would currently recommend continuing with conservative treatment. We discussed the possibility of an MRI if symptoms worsen. We also discussed the possibility of custom orthotics. He has green sports insoles with metatarsal pads which have been helpful and are quite worn. We've replaced his green sports insoles today. He will follow-up as needed.

## 2017-05-17 ENCOUNTER — Encounter: Payer: Self-pay | Admitting: Sports Medicine

## 2017-05-17 ENCOUNTER — Other Ambulatory Visit: Payer: Self-pay | Admitting: Sports Medicine

## 2017-05-17 ENCOUNTER — Ambulatory Visit (INDEPENDENT_AMBULATORY_CARE_PROVIDER_SITE_OTHER): Payer: BC Managed Care – PPO | Admitting: Sports Medicine

## 2017-05-17 VITALS — BP 132/80 | Ht 72.0 in | Wt 235.0 lb

## 2017-05-17 DIAGNOSIS — M25562 Pain in left knee: Secondary | ICD-10-CM

## 2017-05-17 NOTE — Progress Notes (Signed)
   Subjective:    Patient ID: Christopher Collins, male    DOB: 13-Sep-1965, 51 y.o.   MRN: [id number]  HPI chief complaint: Left knee pain  Patient comes in today with returning left knee pain. He was last seen in the office on May 29. Although his knee pain had improved in the interim it had not completely resolved. He injured it further while moving a bookcase in July. He then suffered a valgus injury a few days after that. Since then he has had persistent medial sided knee pain. No swelling. No feelings of instability. No locking or catching. He has tried several weeks of physical therapy without relief. His symptoms actually go back to 2015 when he injured his knee jumping over a fence. Previous x-rays showed minimal degenerative changes in the knee.    Review of Systems As above    Objective:   Physical Exam  Well-developed, well-nourished. No acute distress  Left knee: Full range of motion. No effusion. He is tender to palpation along the medial joint line with a positive Thessaly's. No tenderness along the lateral joint line. He does have some mild laxity with Lachman's testing and anterior drawer testing. PCL is intact. Knee is stable to valgus and varus stressing. Neurovascularly intact distally.  X-rays are as above      Assessment & Plan:  Persistent left knee pain-rule out medial meniscal tear versus anterior cruciate ligament tear  MRI of the left knee to rule out both a medial meniscal tear and a possible anterior cruciate ligament tear. Phone follow-up with those results when available. We will delineate a more definitive treatment plan based on those findings.

## 2017-12-14 ENCOUNTER — Ambulatory Visit: Payer: BC Managed Care – PPO | Admitting: Sports Medicine

## 2017-12-14 VITALS — BP 126/84 | Ht 72.5 in | Wt 230.0 lb

## 2017-12-14 DIAGNOSIS — M25562 Pain in left knee: Secondary | ICD-10-CM | POA: Diagnosis not present

## 2017-12-15 ENCOUNTER — Encounter: Payer: Self-pay | Admitting: Sports Medicine

## 2017-12-15 NOTE — Progress Notes (Signed)
   Subjective:    Patient ID: Christopher Collins, male    DOB: 09/30/1965, 52 y.o.   MRN: [id number]  HPI chief complaint: Left knee pain  Gerald Stabs comes in today with persistent left knee pain. He was last seen in the office back in September. We had decided at that time to get an MRI of his knee to rule out both a meniscal tear and a possible remote anterior cruciate ligament tear. As his symptoms were improving, he decided to wait on the MRI. He has instead been working diligently with physical therapy and for the most part has improved. However,he still has frequent episodes of pain and instability. He's noticed some mild swelling and stiffness. His pain occurs both with sitting as well as with activity. He localizes it to the medial knee. He denies any locking. No radiating pain down the leg. No numbness or tingling. Previous  x-rays have demonstrated only minimal degenerative changes in the knee.    Review of Systems As above    Objective:   Physical Exam  Well-developed, well-nourished. No acute distress. Awake alert and oriented 3. Vital signs reviewed  Left knee: Full range of motion. No obvious effusion. He remains tender to palpation along the medial joint line with a mildly positive Thessaly's. No tenderness along the lateral joint line. Some mild laxity with Lachman's and anterior drawer testing. PCL is intact. Knee is stable to valgus and varus stressing. Neurovascular intact distally.  X-rays of the left knee are as above      Assessment & Plan:   Left knee pain-rule out medial meniscal tear versus anterior cruciate ligament tear  We are going to reorder the MRI of his left knee. Patient has a strong sensitivity to loud noises and has found a relatively quiet MRI in [place], [place]. He will get me their information and we will schedule the MRI to be done there. I've asked him to obtain a copy of the images and the radiology report for my review. We will delineate further  treatment based on those findings.

## 2018-01-04 ENCOUNTER — Telehealth: Payer: Self-pay | Admitting: *Deleted

## 2018-01-04 DIAGNOSIS — M25562 Pain in left knee: Secondary | ICD-10-CM

## 2018-01-04 NOTE — Telephone Encounter (Signed)
Order to be picked up by patient

## 2018-01-17 ENCOUNTER — Telehealth: Payer: Self-pay | Admitting: Sports Medicine

## 2018-01-17 ENCOUNTER — Encounter: Payer: Self-pay | Admitting: Sports Medicine

## 2018-01-17 NOTE — Telephone Encounter (Signed)
xxx

## 2018-01-17 NOTE — Telephone Encounter (Signed)
  I spoke with the patient on the phone today after reviewing the MRI of his left knee that was done in [place]. It is an unremarkable study.Anterior cruciate ligament is intact. No meniscal tear.No significant degenerative changes. No osteochondral lesion. Based on these findings, I recommend that he continue with physical therapy and work on strengthening both his knee and his hip. I've reassured him that it is okay to continue with activity as tolerated. He is reassured the he has no surgical pathology on this study. Follow-up with me as needed.

## 2018-08-24 ENCOUNTER — Emergency Department (HOSPITAL_COMMUNITY): Payer: BC Managed Care – PPO

## 2018-08-24 ENCOUNTER — Emergency Department (HOSPITAL_COMMUNITY)
Admission: EM | Admit: 2018-08-24 | Discharge: 2018-08-25 | Disposition: A | Discharge status: Home / Self Care | Payer: BC Managed Care – PPO | Attending: Emergency Medicine | Admitting: Emergency Medicine

## 2018-08-24 ENCOUNTER — Other Ambulatory Visit: Payer: Self-pay

## 2018-08-24 ENCOUNTER — Encounter (HOSPITAL_COMMUNITY): Payer: Self-pay

## 2018-08-24 DIAGNOSIS — Y939 Activity, unspecified: Secondary | ICD-10-CM | POA: Insufficient documentation

## 2018-08-24 DIAGNOSIS — I10 Essential (primary) hypertension: Secondary | ICD-10-CM | POA: Insufficient documentation

## 2018-08-24 DIAGNOSIS — Z79899 Other long term (current) drug therapy: Secondary | ICD-10-CM | POA: Insufficient documentation

## 2018-08-24 DIAGNOSIS — W458XXA Other foreign body or object entering through skin, initial encounter: Secondary | ICD-10-CM | POA: Insufficient documentation

## 2018-08-24 DIAGNOSIS — S6992XA Unspecified injury of left wrist, hand and finger(s), initial encounter: Secondary | ICD-10-CM | POA: Diagnosis present

## 2018-08-24 DIAGNOSIS — Z23 Encounter for immunization: Secondary | ICD-10-CM | POA: Diagnosis not present

## 2018-08-24 DIAGNOSIS — S61412A Laceration without foreign body of left hand, initial encounter: Secondary | ICD-10-CM | POA: Insufficient documentation

## 2018-08-24 DIAGNOSIS — Y999 Unspecified external cause status: Secondary | ICD-10-CM | POA: Insufficient documentation

## 2018-08-24 DIAGNOSIS — Y929 Unspecified place or not applicable: Secondary | ICD-10-CM | POA: Diagnosis not present

## 2018-08-24 MED ORDER — TETANUS-DIPHTH-ACELL PERTUSSIS 5-2.5-18.5 LF-MCG/0.5 IM SUSP
0.5000 mL | Freq: Once | INTRAMUSCULAR | Status: DC
  Filled 2018-08-24: qty 0.5

## 2018-08-24 MED ORDER — LIDOCAINE HCL (PF) 1 % IJ SOLN
30.0000 mL | Freq: Once | INTRAMUSCULAR | Status: AC
  Administered 2018-08-25: 30 mL
  Filled 2018-08-24: qty 30

## 2018-08-24 NOTE — ED Triage Notes (Signed)
Pt presents with a laceration to his L thumb. He dropped a Facilities manager on it. Bleeding and pain controlled. Full ROM.

## 2018-08-24 NOTE — ED Provider Notes (Signed)
[facility] DEPT Provider Note   CSN: [id number] Arrival date & time: 08/24/18  2236     History   Chief Complaint Chief Complaint  Patient presents with  . Laceration    HPI RIDWAN BONDY is a 52 y.o. male with history of hypertension, bipolar disorder who presents with laceration to his left hand.  He reports he was moving a piece of woodworking machine when it fell in his hand.  A metal piece cut to lacerations in the palmar aspect of his proximal thumb.  He denies any numbness or tingling.  He has full range of motion of the digit.  He does report he has some pain in the bone. His tetanus is not up-to-date. He is left handed.  HPI  Past Medical History:  Diagnosis Date  . Abrasion of finger of right hand 03/21/2013   middle finger  . Arthritis    hands  . Bipolar disorder ([place])    states mild  . 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 Dental crown present    also dental cap - lower  . Depression   . Hypersomnia with sleep apnea   . Hypertension    under control with meds., has been on med. x 2 yr.  . Mucoid cyst of joint 03/2013   right middle finger  . Sleep apnea with use of continuous positive airway pressure (CPAP)     Patient Active Problem List   Diagnosis Date Noted  . Pain, joint, knee, left 02/27/2014  . OSA on CPAP 08/28/2013  . Hypersomnia with sleep apnea, unspecified 08/28/2013  . Hypersomnia with sleep apnea   . Hypersomnia, persistent 04/17/2013  . Sleep apnea with use of continuous positive airway pressure (CPAP) 02/06/2013  . Pain in joint, ankle and foot 11/13/2012  . Abnormality of gait 11/13/2012    Past Surgical History:  Procedure Laterality Date  . CHOLECYSTECTOMY  2000s  . FINGER SURGERY Right 03/2013   right middle finger-cyst removed  . TONSILLECTOMY AND ADENOIDECTOMY     as a child        Home Medications    Prior to Admission medications   Medication Sig Start Date End  Date Taking? Authorizing Provider  amLODipine (NORVASC) 5 MG tablet Take 5 mg by mouth daily.    [provider]  AMLODIPINE BESYLATE PO Take by mouth.    [provider]  b complex vitamins capsule Take 1 capsule by mouth daily.    [provider]  busPIRone (BUSPAR) 15 MG tablet TAKE AS DIRECTED UP TO 1 TABLET TWICE A DAY 12/07/16   [provider]  Cholecalciferol (VITAMIN D-1000 MAX ST) 1000 units tablet Take by mouth.    [provider]  Cholecalciferol (VITAMIN D3) 5000 UNITS CAPS Take 10,000 Int'l Units by mouth daily.     [provider]  Cyanocobalamin (VITAMIN B-12) 1000 MCG SUBL Take by mouth.    [provider]  cyclobenzaprine (FLEXERIL) 10 MG tablet Take 10 mg by mouth as needed.  07/09/14   [provider]  econazole nitrate 1 % cream 1 APPLICATION TO AFFECTED AREA ONCE A DAY EXTERNALLY 30 DAYS 10/19/16   [provider]  HYDROcodone-acetaminophen (NORCO/VICODIN) 5-325 MG tablet Take by mouth. 03/27/13   [provider]  ketoconazole (NIZORAL) 2 % cream 1 APPLICATION TO AFFECTED AREA AS NEEDED EXTERNALLY 30 DAYS 10/17/16   [provider]  lamoTRIgine (LAMICTAL) 100 MG tablet Take 100  mg by mouth daily.    [provider]  lisinopril (PRINIVIL,ZESTRIL) 10 MG tablet Take 10 mg by mouth daily.    [provider]  Melatonin 3 MG TABS Take 6 mg by mouth at bedtime.     [provider]  mirtazapine (REMERON) 30 MG tablet Take by mouth.    [provider]  mirtazapine (REMERON) 45 MG tablet Take 45 mg by mouth at bedtime.    [provider]  mometasone (NASONEX) 50 MCG/ACT nasal spray Place 2 sprays into the nose daily.  04/07/14   [provider]  montelukast (SINGULAIR) 10 MG tablet Take 1 tablet (10 mg total) by mouth daily. 08/10/15   Kozlow, Donnamarie Poag, MD  OMEGA 3 1200 MG CAPS Take 2,400 mg by mouth daily.     [provider]    traZODone (DESYREL) 100 MG tablet 100 mg. 50 -100 mg  as needed. 02/07/14   [provider]    Family History History reviewed. No pertinent family history.  Social History Social History   Tobacco Use  . Smoking status: Never Smoker  . Smokeless tobacco: Never Used  Substance Use Topics  . Alcohol use: Yes    Comment: occasionally  . Drug use: No     Allergies   Nuvigil [armodafinil]; Gluten meal; and Milk-related compounds   Review of Systems Review of Systems  Musculoskeletal: Positive for arthralgias.  Skin: Positive for wound.  Neurological: Negative for numbness.     Physical Exam Updated Vital Signs BP (!) 140/91 (BP Location: Right Arm)   Pulse 61   Temp 98.2 F (36.8 C) (Oral)   Resp 16   Ht 6' (1.829 m)   Wt 94.3 kg   SpO2 98%   BMI 28.21 kg/m   Physical Exam Vitals signs and nursing note reviewed.  Constitutional:      General: He is not in acute distress.    Appearance: He is well-developed. He is not diaphoretic.  HENT:     Head: Normocephalic and atraumatic.     Mouth/Throat:     Pharynx: No oropharyngeal exudate.  Eyes:     General: No scleral icterus.       Right eye: No discharge.        Left eye: No discharge.     Conjunctiva/sclera: Conjunctivae normal.     Pupils: Pupils are equal, round, and reactive to light.  Neck:     Musculoskeletal: Normal range of motion and neck supple.     Thyroid: No thyromegaly.  Cardiovascular:     Rate and Rhythm: Normal rate and regular rhythm.     Heart sounds: Normal heart sounds. No murmur. No friction rub. No gallop.   Pulmonary:     Effort: Pulmonary effort is normal. No respiratory distress.     Breath sounds: Normal breath sounds. No stridor. No wheezing or rales.  Abdominal:     General: Bowel sounds are normal. There is no distension.     Palpations: Abdomen is soft.     Tenderness: There is no abdominal tenderness. There is no guarding or rebound.  Musculoskeletal:        Hands:  Lymphadenopathy:     Cervical: No cervical adenopathy.  Skin:    General: Skin is warm and dry.     Coloration: Skin is not pale.     Findings: No rash.  Neurological:     Mental Status: He is alert.     Coordination: Coordination normal.  ED Treatments / Results  Labs (all labs ordered are listed, but only abnormal results are displayed) Labs Reviewed - No data to display  EKG None  Radiology Dg Hand Complete Left  Result Date: 08/24/2018 CLINICAL DATA:  Hand laceration EXAM: LEFT HAND - COMPLETE 3+ VIEW COMPARISON:  None. FINDINGS: No fracture or malalignment. Arthritis at the DIP joints, most marked involving the second and third digits. Mild PIP joint space narrowing. No radiopaque foreign body. IMPRESSION: No acute osseous abnormality. Electronically Signed   By: Donavan Foil M.D.   On: 08/24/2018 23:49    Procedures .Marland KitchenLaceration Repair Date/Time: 08/25/2018 2:02 AM Performed by: Frederica Kuster, PA-C Authorized by: Frederica Kuster, PA-C   Consent:    Consent obtained:  Verbal   Consent given by:  Patient   Risks discussed:  Infection, pain and tendon damage   Alternatives discussed:  No treatment Anesthesia (see MAR for exact dosages):    Anesthesia method:  Local infiltration   Local anesthetic:  Lidocaine 1% w/o epi Laceration details:    Location:  Hand   Hand location:  L palm   Length (cm):  2 Repair type:    Repair type:  Simple Pre-procedure details:    Preparation:  Imaging obtained to evaluate for foreign bodies and patient was prepped and draped in usual sterile fashion Exploration:    Hemostasis achieved with:  Direct pressure   Wound exploration: wound explored through full range of motion and entire depth of wound probed and visualized     Wound extent: no foreign bodies/material noted and no tendon damage noted (possible visualized tendon intact)     Contaminated: no   Treatment:    Area cleansed with:  Saline   Amount of  cleaning:  Standard   Irrigation solution:  Sterile saline   Irrigation volume:  80   Irrigation method:  Syringe   Visualized foreign bodies/material removed: no   Skin repair:    Repair method:  Sutures   Suture size:  5-0   Wound skin closure material used: Ethilon.   Suture technique:  Simple interrupted   Number of sutures:  3 Approximation:    Approximation:  Close Post-procedure details:    Dressing:  Antibiotic ointment, non-adherent dressing and splint for protection   Patient tolerance of procedure:  Tolerated well, no immediate complications .Marland KitchenLaceration Repair Date/Time: 08/25/2018 2:04 AM Performed by: Frederica Kuster, PA-C Authorized by: Frederica Kuster, PA-C   Consent:    Consent obtained:  Verbal   Consent given by:  Patient   Risks discussed:  Infection and pain   Alternatives discussed:  No treatment Anesthesia (see MAR for exact dosages):    Anesthesia method:  None Laceration details:    Location:  Hand   Hand location:  L palm   Length (cm):  1 Repair type:    Repair type:  Simple Pre-procedure details:    Preparation:  Patient was prepped and draped in usual sterile fashion and imaging obtained to evaluate for foreign bodies Exploration:    Hemostasis achieved with:  Direct pressure   Wound exploration: wound explored through full range of motion and entire depth of wound probed and visualized     Wound extent: no foreign bodies/material noted     Contaminated: no   Treatment:    Area cleansed with:  Saline   Amount of cleaning:  Standard   Irrigation solution:  Sterile saline   Irrigation volume:  20   Irrigation method:  Syringe   Visualized foreign bodies/material removed: no   Skin repair:    Repair method:  Sutures   Suture size:  5-0   Wound skin closure material used: Ethilon.   Suture technique:  Simple interrupted   Number of sutures:  2 Approximation:    Approximation:  Close Post-procedure details:    Dressing:  Antibiotic  ointment, non-adherent dressing and splint for protection   Patient tolerance of procedure:  Tolerated well, no immediate complications   (including critical care time)  Medications Ordered in ED Medications  Tdap (BOOSTRIX) injection 0.5 mL (0.5 mLs Intramuscular Not Given 08/25/18 0108)  lidocaine (PF) (XYLOCAINE) 1 % injection 30 mL (30 mLs Infiltration Given 08/25/18 0059)  Tdap (BOOSTRIX) injection 0.5 mL (0.5 mLs Intramuscular Given 08/25/18 0100)     Initial Impression / Assessment and Plan / ED Course  I have reviewed the triage vital signs and the nursing notes.  Pertinent labs & imaging results that were available during my care of the patient were reviewed by me and considered in my medical decision making (see chart for details).     Patient with 2 lacerations to left hand after a piece of woodworking equipment fell on it.  X-rays negative for fracture and foreign body.  Tetanus UTD. Laceration occurred < 12 hours prior to repair.  Wound repaired as above.  Patient provided with thumb spica for immobilization for the first few days.  Discussed laceration care with pt and answered questions. Pt to f-u for suture removal in 10 days and wound check sooner should there be signs of dehiscence or infection. Pt is hemodynamically stable with no complaints prior to dc.     Final Clinical Impressions(s) / ED Diagnoses   Final diagnoses:  Laceration of left hand without foreign body, initial encounter    ED Discharge Orders    None       Frederica Kuster, PA-C 08/25/18 0207    Varney Biles, MD 08/25/18 [phone]

## 2018-08-25 MED ORDER — TETANUS-DIPHTH-ACELL PERTUSSIS 5-2.5-18.5 LF-MCG/0.5 IM SUSP
0.5000 mL | Freq: Once | INTRAMUSCULAR | Status: AC
  Administered 2018-08-25: 0.5 mL via INTRAMUSCULAR
  Filled 2018-08-25: qty 0.5

## 2018-08-25 NOTE — Discharge Instructions (Addendum)
Treatment: Keep your wound dry and dressing applied until this time tomorrow. After 24 hours, you may wash with warm soapy water. Dry and apply antibiotic ointment and clean dressing. Do this daily until your sutures are removed.  Wear splint for 3 to 4 days while you are awake to help prevent sutures from pulling out.  Follow-up: If you develop problems with range of motion of your thumb, he can follow-up with a hand doctor below.  Please follow-up with your primary care provider or return to emergency department in 10 days for suture removal. Be aware of signs of infection: fever, increasing pain, redness, swelling, drainage from the area. Please call your primary care provider or return to emergency department if you develop any of these symptoms or if any of the sutures come out prior to removal. Please return to the emergency department if you develop any other new or worsening symptoms.

## 2018-08-25 NOTE — ED Notes (Signed)
Dry dressing applied to area of injury, thumb spica applied.  Pt states readiness for DC, understanding of DC instructions.

## 2018-08-30 DIAGNOSIS — S61012A Laceration without foreign body of left thumb without damage to nail, initial encounter: Secondary | ICD-10-CM | POA: Insufficient documentation

## 2019-10-04 DIAGNOSIS — H9312 Tinnitus, left ear: Secondary | ICD-10-CM | POA: Insufficient documentation

## 2020-01-02 DIAGNOSIS — R432 Parageusia: Secondary | ICD-10-CM | POA: Insufficient documentation

## 2020-01-02 DIAGNOSIS — H833X3 Noise effects on inner ear, bilateral: Secondary | ICD-10-CM | POA: Insufficient documentation

## 2020-01-29 ENCOUNTER — Other Ambulatory Visit: Payer: Self-pay

## 2020-01-29 ENCOUNTER — Emergency Department (HOSPITAL_COMMUNITY)
Admission: EM | Admit: 2020-01-29 | Discharge: 2020-01-30 | Disposition: A | Discharge status: Home / Self Care | Payer: BC Managed Care – PPO | Attending: Emergency Medicine | Admitting: Emergency Medicine

## 2020-01-29 ENCOUNTER — Encounter (HOSPITAL_COMMUNITY): Payer: Self-pay | Admitting: Emergency Medicine

## 2020-01-29 ENCOUNTER — Emergency Department (HOSPITAL_COMMUNITY): Payer: BC Managed Care – PPO

## 2020-01-29 DIAGNOSIS — I10 Essential (primary) hypertension: Secondary | ICD-10-CM | POA: Insufficient documentation

## 2020-01-29 DIAGNOSIS — R0602 Shortness of breath: Secondary | ICD-10-CM | POA: Insufficient documentation

## 2020-01-29 DIAGNOSIS — R0789 Other chest pain: Secondary | ICD-10-CM | POA: Diagnosis present

## 2020-01-29 DIAGNOSIS — R202 Paresthesia of skin: Secondary | ICD-10-CM | POA: Diagnosis not present

## 2020-01-29 DIAGNOSIS — F319 Bipolar disorder, unspecified: Secondary | ICD-10-CM | POA: Diagnosis not present

## 2020-01-29 DIAGNOSIS — Z79899 Other long term (current) drug therapy: Secondary | ICD-10-CM | POA: Diagnosis not present

## 2020-01-29 DIAGNOSIS — R42 Dizziness and giddiness: Secondary | ICD-10-CM | POA: Insufficient documentation

## 2020-01-29 DIAGNOSIS — R079 Chest pain, unspecified: Secondary | ICD-10-CM

## 2020-01-29 LAB — CBC
HCT: 47.3 % (ref 39.0–52.0)
Hemoglobin: 16.6 g/dL (ref 13.0–17.0)
MCH: 32.9 pg (ref 26.0–34.0)
MCHC: 35.1 g/dL (ref 30.0–36.0)
MCV: 93.7 fL (ref 80.0–100.0)
Platelets: 256 10*3/uL (ref 150–400)
RBC: 5.05 MIL/uL (ref 4.22–5.81)
RDW: 12.8 % (ref 11.5–15.5)
WBC: 10.8 10*3/uL — ABNORMAL HIGH (ref 4.0–10.5)
nRBC: 0 % (ref 0.0–0.2)

## 2020-01-29 LAB — BASIC METABOLIC PANEL
Anion gap: 12 (ref 5–15)
BUN: 12 mg/dL (ref 6–20)
CO2: 25 mmol/L (ref 22–32)
Calcium: 9 mg/dL (ref 8.9–10.3)
Chloride: 101 mmol/L (ref 98–111)
Creatinine, Ser: 0.92 mg/dL (ref 0.61–1.24)
GFR calc Af Amer: >60 mL/min (ref >60)
GFR calc non Af Amer: >60 mL/min (ref >60)
Glucose, Bld: 105 mg/dL — ABNORMAL HIGH (ref 70–99)
Potassium: 4 mmol/L (ref 3.5–5.1)
Sodium: 138 mmol/L (ref 135–145)

## 2020-01-29 LAB — TROPONIN I (HIGH SENSITIVITY)
Troponin I (High Sensitivity): 5 ng/L (ref <18)
Troponin I (High Sensitivity): 6 ng/L (ref <18)

## 2020-01-29 MED ORDER — SODIUM CHLORIDE 0.9% FLUSH: 3.0000 mL | Freq: Once | INTRAVENOUS | Status: DC

## 2020-01-29 NOTE — ED Triage Notes (Signed)
Patient arrives to ED with complaints of an episode of left sided chest pain that radiates to his left arm, shortness of breath, and lightheadedness today his his MD's office. Patient states he's had chest discomfort on and off for awhile now. Patient states the pain has gone away but his hand continues to feel numb. Patient was recently on prednisone for ear trauma this month.

## 2020-01-30 DIAGNOSIS — Z7189 Other specified counseling: Secondary | ICD-10-CM | POA: Insufficient documentation

## 2020-01-30 DIAGNOSIS — R072 Precordial pain: Secondary | ICD-10-CM | POA: Insufficient documentation

## 2020-01-30 DIAGNOSIS — I10 Essential (primary) hypertension: Secondary | ICD-10-CM | POA: Insufficient documentation

## 2020-01-30 NOTE — Discharge Instructions (Signed)
Your work-up today was normal. Labs attached on back for you. Follow-up with your primary care doctor. Information listed for cardiology clinic-- you may call and set up appt or have your primary care doctor arrange this for you. Return here for any new/acute changes.

## 2020-01-30 NOTE — Progress Notes (Signed)
Cardiology Office Note   Date:  01/31/2020   ID:  Christopher Collins, DOB 1966-08-11, MRN [id number]  PCP:  Glenis Smoker, MD  Cardiologist:   No primary care provider on file. Referring:  Glenis Smoker, MD  Chief Complaint  Patient presents with  . Chest Pain      History of Present Illness: Christopher Collins is a 54 y.o. male who presents for evaluation of chest pain.   He was in the ED for this.  I reviewed these records for this visit.   There was no evidence of ischemia.    He has no prior cardiac history although he does have some risk factors.  His blood pressure has been labile.  He said he has been prediabetic.  He does have a family history.  He gets discomfort with sporadic periods happening several times a month.  He describes an uneasy feeling.  He has some discomfort in his chest that is mild.  This happens at rest.  He can't bring it on with activities.  What bothers him most the other day was some left arm numbness associated with this.  He has had associated cold sweats.  It is a left upper discomfort not going to his shoulder or his jaw.  He has not been overly active but he does some yard work.  He can't bring it on with that.   Past Medical History:  Diagnosis Date  . Arthritis    hands  . Bipolar disorder ([place])    states mild  . 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 Dental crown present    also dental cap - lower  . Depression   . Hypersomnia with sleep apnea   . Hypertension    under control with meds., has been on med. x 2 yr.  . Mucoid cyst of joint 03/2013   right middle finger  . Sleep apnea with use of continuous positive airway pressure (CPAP)     Past Surgical History:  Procedure Laterality Date  . CHOLECYSTECTOMY  2000s  . FINGER SURGERY Right 03/2013   right middle finger-cyst removed  . TONSILLECTOMY AND ADENOIDECTOMY     as a child     Current Outpatient Medications  Medication Sig Dispense  Refill  . amLODipine (NORVASC) 5 MG tablet Take 10 mg by mouth daily.     Marland Kitchen AMLODIPINE BESYLATE PO Take by mouth.    Marland Kitchen b complex vitamins capsule Take 1 capsule by mouth daily.    . busPIRone (BUSPAR) 15 MG tablet TAKE AS DIRECTED UP TO 1 TABLET TWICE A DAY  2  . Cholecalciferol (VITAMIN D-1000 MAX ST) 1000 units tablet Take by mouth.    . Cholecalciferol (VITAMIN D3) 5000 UNITS CAPS Take 10,000 Int'l Units by mouth daily.     . Cyanocobalamin (VITAMIN B-12) 1000 MCG SUBL Take by mouth.    . cyclobenzaprine (FLEXERIL) 10 MG tablet Take 10 mg by mouth as needed.   1  . econazole nitrate 1 % cream 1 APPLICATION TO AFFECTED AREA ONCE A DAY EXTERNALLY 30 DAYS  0  . HYDROcodone-acetaminophen (NORCO/VICODIN) 5-325 MG tablet Take by mouth.    Marland Kitchen ketoconazole (NIZORAL) 2 % cream 1 APPLICATION TO AFFECTED AREA AS NEEDED EXTERNALLY 30 DAYS  5  . lamoTRIgine (LAMICTAL) 100 MG tablet Take 125 mg by mouth daily.     Marland Kitchen lisinopril (PRINIVIL,ZESTRIL) 10 MG tablet Take 10 mg by mouth daily.    Marland Kitchen  Melatonin 3 MG TABS Take 1 mg by mouth at bedtime.     . mirtazapine (REMERON) 45 MG tablet Take 45 mg by mouth at bedtime.    . mometasone (NASONEX) 50 MCG/ACT nasal spray Place 2 sprays into the nose daily.     . montelukast (SINGULAIR) 10 MG tablet Take 1 tablet (10 mg total) by mouth daily. 30 tablet 0  . traZODone (DESYREL) 100 MG tablet 100 mg. 50 -100 mg  as needed.     No current facility-administered medications for this visit.    Allergies:   Nuvigil [armodafinil], Gluten meal, and Milk-related compounds    Social History:  The patient  reports that he has never smoked. He has never used smokeless tobacco. He reports current alcohol use. He reports that he does not use drugs.   Family History:  The patient's family history includes AAA (abdominal aortic aneurysm) in his father and paternal grandfather; Heart attack (age of onset: 68) in his paternal grandfather; Lung cancer in his father.    ROS:   Please see the history of present illness.   Otherwise, review of systems are positive for carpal tunnel, hearing loss, tinnitus.   All other systems are reviewed and negative.    PHYSICAL EXAM: VS:  BP 140/88   Pulse 79   Ht 6' (1.829 m)   Wt 222 lb 9.6 oz (101 kg)   SpO2 98%   BMI 30.19 kg/m  , BMI Body mass index is 30.19 kg/m. GENERAL:  Well appearing HEENT:  Pupils equal round and reactive, fundi not visualized, oral mucosa unremarkable NECK:  No jugular venous distention, waveform within normal limits, carotid upstroke brisk and symmetric, no bruits, no thyromegaly LYMPHATICS:  No cervical, inguinal adenopathy LUNGS:  Clear to auscultation bilaterally BACK:  No CVA tenderness CHEST:  Unremarkable HEART:  PMI not displaced or sustained,S1 and S2 within normal limits, no S3, no S4, no clicks, no rubs, no murmurs ABD:  Flat, positive bowel sounds normal in frequency in pitch, no bruits, no rebound, no guarding, possible midline pulsatile mass, no hepatomegaly, no splenomegaly EXT:  2 plus pulses throughout, no edema, no cyanosis no clubbing SKIN:  No rashes no nodules NEURO:  Cranial nerves II through XII grossly intact, motor grossly intact throughout PSYCH:  Cognitively intact, oriented to person place and time    EKG:  EKG is ordered today. The ekg ordered today demonstrates sinus rhythm, rate 71, axis within normal limits, intervals within normal limits, no acute ST.   Recent Labs: 01/29/2020: BUN 12; Creatinine, Ser 0.92; Hemoglobin 16.6; Platelets 256; Potassium 4.0; Sodium 138    Lipid Panel No results found for: CHOL, TRIG, HDL, CHOLHDL, VLDL, LDLCALC, LDLDIRECT    Wt Readings from Last 3 Encounters:  01/31/20 222 lb 9.6 oz (101 kg)  01/29/20 214 lb (97.1 kg)  08/24/18 208 lb (94.3 kg)      Other studies Reviewed: Additional studies/ records that were reviewed today include: ED records. Review of the above records demonstrates:  Please see elsewhere in the  note.     ASSESSMENT AND PLAN:  CHEST PAIN:   Patient's chest pain is somewhat atypical but he does have risk factors. I will bring the patient back for a POET (Plain Old Exercise Test). This will allow me to screen for obstructive coronary disease, risk stratify and very importantly provide a prescription for exercise.  I will also do a coronary calcium score.  HTN: His blood pressure is mildly elevated but  he just had his amlodipine increased.  He will continue with following up his own blood pressures in a week conversing titration further as needed.  RISK REDUCTION: His LDL was 130 and his HDL 53.  Goals of therapy will be based on the results of the above.  MIDLINE ABDOMINAL MASS: The patient has a possible midline abdominal mass with a strong family history abdominal aneurysm in a first-degree relative and also in his grandfather on the father's side.  I will screen with an abdominal ultrasound.  COVID EDUCATION: He has had his vaccine.  Current medicines are reviewed at length with the patient today.  The patient does not have concerns regarding medicines.  The following changes have been made:  no change  Labs/ tests ordered today include:   Orders Placed This Encounter  Procedures  . CT CARDIAC SCORING  . EXERCISE TOLERANCE TEST (ETT)  . EKG 12-Lead  . VAS [place] AAA DUPLEX     Disposition:   FU with me as needed.     Signed, Minus Breeding, MD  01/31/2020 2:21 PM    [facility]

## 2020-01-30 NOTE — ED Notes (Signed)
Patient verbalizes understanding of discharge instructions. Opportunity for questioning and answers were provided. Armband removed by staff, pt discharged from ED stable & ambulatory  

## 2020-01-30 NOTE — ED Provider Notes (Signed)
[facility] EMERGENCY DEPARTMENT Provider Note   CSN: [id number] Arrival date & time: 01/29/20  1549     History Chief Complaint  Patient presents with  . Chest Pain  . Shortness of Breath  . Dizziness    Christopher Collins is a 54 y.o. male.  The history is provided by the patient and medical records.    54 y.o. M with hx of arthritis, depression, HTN, sleep apnea, recent auditory trauma, presenting to the ED for chest pain.  Patient reports he has had some intermittent chest pain for several weeks to months now.  He states this is random in onset, usually localized to left upper chest lasting a few minutes or so before resolving.  He describes it as more of a "discomfort" than a pain.  States often he is able to push on his chest and reproduce pain thinking it is musculoskeletal.  He states this does not seem associated with exertion, he is able to exercise and walk without issue.  He has never been evaluated for this.  States he was at his ophthalmologist office today to have his eyes dilated when he started feeling unwell in the lobby.  States he broke out in a sweat and was taken back to a room.  Doctor gave him some apple juice and called EMS.  He states afterwards he developed some chest pain and was transported here for further evaluation.  Unfortunately, patient with prolonged wait in our lobby of greater than 8 hours.  He is asymptomatic by time of my evaluation.  He does report paternal grandfather had history of aortic aneurysm as well as MI x2 at a relatively young age.  Patient has never smoked but was exposed to secondhand smoke.  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    Patient also reports some tingling along left lateral hand and fifth digit.  He has a history of carpal tunnel and has to sleep in nightly wrist braces.  States sensation in his left hand feels like his carpal tunnel but just in a different location.   He does not have pain radiating down the arm from the shoulder or neck, more from about mid forearm.  Symptoms do improve with movement.  He denies any focal numbness or wrist drop.  Past Medical History:  Diagnosis Date  . Abrasion of finger of right hand 03/21/2013   middle finger  . Arthritis    hands  . Bipolar disorder ([place])    states mild  . 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 Dental crown present    also dental cap - lower  . Depression   . Hypersomnia with sleep apnea   . Hypertension    under control with meds., has been on med. x 2 yr.  . Mucoid cyst of joint 03/2013   right middle finger  . Sleep apnea with use of continuous positive airway pressure (CPAP)     Patient Active Problem List   Diagnosis Date Noted  . Pain, joint, knee, left 02/27/2014  . OSA on CPAP 08/28/2013  . Hypersomnia with sleep apnea, unspecified 08/28/2013  . Hypersomnia with sleep apnea   . Hypersomnia, persistent 04/17/2013  . Sleep apnea with use of continuous positive airway pressure (CPAP) 02/06/2013  . Pain in joint, ankle and foot 11/13/2012  . Abnormality of gait 11/13/2012    Past Surgical History:  Procedure Laterality  Date  . CHOLECYSTECTOMY  2000s  . FINGER SURGERY Right 03/2013   right middle finger-cyst removed  . TONSILLECTOMY AND ADENOIDECTOMY     as a child       History reviewed. No pertinent family history.  Social History   Tobacco Use  . Smoking status: Never Smoker  . Smokeless tobacco: Never Used  Substance Use Topics  . Alcohol use: Yes    Comment: occasionally  . Drug use: No    Home Medications Prior to Admission medications   Medication Sig Start Date End Date Taking? Authorizing Provider  amLODipine (NORVASC) 5 MG tablet Take 5 mg by mouth daily.    [provider]  AMLODIPINE BESYLATE PO Take by mouth.    [provider]  b complex vitamins capsule Take 1 capsule by mouth daily.    [provider]    busPIRone (BUSPAR) 15 MG tablet TAKE AS DIRECTED UP TO 1 TABLET TWICE A DAY 12/07/16   [provider]  Cholecalciferol (VITAMIN D-1000 MAX ST) 1000 units tablet Take by mouth.    [provider]  Cholecalciferol (VITAMIN D3) 5000 UNITS CAPS Take 10,000 Int'l Units by mouth daily.     [provider]  Cyanocobalamin (VITAMIN B-12) 1000 MCG SUBL Take by mouth.    [provider]  cyclobenzaprine (FLEXERIL) 10 MG tablet Take 10 mg by mouth as needed.  07/09/14   [provider]  econazole nitrate 1 % cream 1 APPLICATION TO AFFECTED AREA ONCE A DAY EXTERNALLY 30 DAYS 10/19/16   [provider]  HYDROcodone-acetaminophen (NORCO/VICODIN) 5-325 MG tablet Take by mouth. 03/27/13   [provider]  ketoconazole (NIZORAL) 2 % cream 1 APPLICATION TO AFFECTED AREA AS NEEDED EXTERNALLY 30 DAYS 10/17/16   [provider]  lamoTRIgine (LAMICTAL) 100 MG tablet Take 100 mg by mouth daily.    [provider]  lisinopril (PRINIVIL,ZESTRIL) 10 MG tablet Take 10 mg by mouth daily.    [provider]  Melatonin 3 MG TABS Take 6 mg by mouth at bedtime.     [provider]  mirtazapine (REMERON) 30 MG tablet Take by mouth.    [provider]  mirtazapine (REMERON) 45 MG tablet Take 45 mg by mouth at bedtime.    [provider]  mometasone (NASONEX) 50 MCG/ACT nasal spray Place 2 sprays into the nose daily.  04/07/14   [provider]  montelukast (SINGULAIR) 10 MG tablet Take 1 tablet (10 mg total) by mouth daily. 08/10/15   Kozlow, Donnamarie Poag, MD  OMEGA 3 1200 MG CAPS Take 2,400 mg by mouth daily.     [provider]  traZODone (DESYREL) 100 MG tablet 100 mg. 50 -100 mg  as needed. 02/07/14   [provider]    Allergies    Nuvigil [armodafinil], Gluten meal, and Milk-related compounds  Review of Systems   Review of Systems  Cardiovascular: Positive for chest pain.  All other  systems reviewed and are negative.   Physical Exam Updated Vital Signs BP 138/81   Pulse 68   Temp 98.2 F (36.8 C) (Oral)   Resp 16   Ht 6' (1.829 m)   Wt 97.1 kg   SpO2 97%   BMI 29.02 kg/m   Physical Exam Vitals and nursing note reviewed.  Constitutional:      Appearance: He is well-developed.  HENT:     Head: Normocephalic and atraumatic.  Eyes:     Conjunctiva/sclera: Conjunctivae normal.  Pupils: Pupils are equal, round, and reactive to light.  Cardiovascular:     Rate and Rhythm: Normal rate and regular rhythm.     Heart sounds: Normal heart sounds.  Pulmonary:     Effort: Pulmonary effort is normal.     Breath sounds: Normal breath sounds. No decreased breath sounds, wheezing or rhonchi.  Chest:     Comments: Chest wall nontender Abdominal:     General: Bowel sounds are normal.     Palpations: Abdomen is soft.  Musculoskeletal:        General: Normal range of motion.     Cervical back: Normal range of motion.     Comments: Left hand is grossly normal in appearance, endorses "tingling" down the lateral left hand and into the fifth digit, there is no focal strength or sensory deficit, no wrist drop, normal range of motion of the fingers, normal grip strength, radial pulse intact, good cap refill to all fingers  Skin:    General: Skin is warm and dry.  Neurological:     Mental Status: He is alert and oriented to person, place, and time.     ED Results / Procedures / Treatments   Labs (all labs ordered are listed, but only abnormal results are displayed) Labs Reviewed  BASIC METABOLIC PANEL - Abnormal; Notable for the following components:      Result Value   Glucose, Bld 105 (*)    All other components within normal limits  CBC - Abnormal; Notable for the following components:   WBC 10.8 (*)    All other components within normal limits  TROPONIN I (HIGH SENSITIVITY)  TROPONIN I (HIGH SENSITIVITY)    EKG EKG Interpretation  Date/Time:  Wednesday  Jan 29 2020 16:18:31 EDT Ventricular Rate:  78 PR Interval:  164 QRS Duration: 92 QT Interval:  360 QTC Calculation: 410 R Axis:   85 Text Interpretation: Normal sinus rhythm Nonspecific ST abnormality Abnormal ECG When compared with ECG of 03/25/2013, No significant change was found Confirmed by Delora Fuel ([id number]) on 01/29/2020 11:39:04 PM   Radiology DG Chest 2 View  Result Date: 01/29/2020 CLINICAL DATA:  Chest pain. Additional history provided: Shortness of breath, lightheadedness. EXAM: CHEST - 2 VIEW COMPARISON:  No pertinent prior studies available for comparison. FINDINGS: Heart size within normal limits. There is no appreciable airspace consolidation. No evidence of pleural effusion or pneumothorax. No acute bony abnormality identified. IMPRESSION: No evidence of acute cardiopulmonary abnormality. Electronically Signed   By: Kellie Simmering DO   On: 01/29/2020 17:28    Procedures Procedures (including critical care time)  Medications Ordered in ED Medications  sodium chloride flush (NS) 0.9 % injection 3 mL (has no administration in time range)    ED Course  I have reviewed the triage vital signs and the nursing notes.  Pertinent labs & imaging results that were available during my care of the patient were reviewed by me and considered in my medical decision making (see chart for details).    MDM Rules/Calculators/A&P  54 year old male here with intermittent chest discomfort over the past several weeks to months, and some tingling of the left lateral hand and fifth digit.  After talking with patient, these 2 issues seem unrelated.  Chest pain is atypical, not necessarily associated with exertion.  He does have some noted risk factors as well as family history but no personal cardiac history.  His EKG today is unchanged from prior.  Labs are reassuring including troponin x2.  Chest x-ray is clear.  He is asymptomatic at the time of my evaluation.  Left fifth digit tingling  associated up to about mid lateral forearm.  No pain along the elbow.  He has no wrist drop or other focal deficit with the hand.  This may be related to ulnar nerve issues as he does have history of carpal tunnel as well.  With negative evaluation, feel he stable for discharge home.  Will refer to cardiology for follow-up.  Can continue wearing his wrist braces at home to help with hands.  Follow-up with PCP.  Return here for any new or acute changes.  Final Clinical Impression(s) / ED Diagnoses Final diagnoses:  Chest pain in adult    Rx / DC Orders ED Discharge Orders    None       Larene Pickett, PA-C [id number] [phone]    Delora Fuel, MD [id number] [phone]

## 2020-01-31 ENCOUNTER — Encounter: Payer: Self-pay | Admitting: Cardiology

## 2020-01-31 ENCOUNTER — Other Ambulatory Visit: Payer: Self-pay

## 2020-01-31 ENCOUNTER — Ambulatory Visit: Payer: BC Managed Care – PPO | Admitting: Cardiology

## 2020-01-31 VITALS — BP 140/88 | HR 79 | Ht 72.0 in | Wt 222.6 lb

## 2020-01-31 DIAGNOSIS — Z7189 Other specified counseling: Secondary | ICD-10-CM | POA: Diagnosis not present

## 2020-01-31 DIAGNOSIS — I10 Essential (primary) hypertension: Secondary | ICD-10-CM

## 2020-01-31 DIAGNOSIS — R1900 Intra-abdominal and pelvic swelling, mass and lump, unspecified site: Secondary | ICD-10-CM

## 2020-01-31 DIAGNOSIS — R072 Precordial pain: Secondary | ICD-10-CM | POA: Diagnosis not present

## 2020-01-31 NOTE — Patient Instructions (Signed)
Medication Instructions:  NO CHANGES *If you need a refill on your cardiac medications before your next appointment, please call your pharmacy*  Lab Work: You will need a Covid Screening 3 days prior to your exercise tolerance test. You will need to self quarantine after the screening until your procedure.This is a Drive Up Visit at the [facility] [address] [place]. Someone will direct you to the appropriate testing line. Stay in your car and someone will be with you shortly  Testing/Procedures: Your physician has requested that you have an exercise tolerance test. For further information please visit [email or web address]. Please also follow instruction sheet, as given.  Your physician has requested that you have an abdominal aorta duplex. During this test, an ultrasound is used to evaluate the aorta. Allow 30 minutes for this exam. Do not eat after midnight the day before and avoid carbonated beverages  CORONARY CALCIUM SCORE  Follow-Up: At [facility], you and your health needs are our priority.  As part of our continuing mission to provide you with exceptional heart care, we have created designated Provider Care Teams.  These Care Teams include your primary Cardiologist (physician) and Advanced Practice Providers (APPs -  Physician Assistants and Nurse Practitioners) who all work together to provide you with the care you need, when you need it.  Your next appointment:   FOLLOW UP AS NEEDED  Other Instructions Dr. Percival Spanish has ordered a CT coronary calcium score. This test is done at [address]. This is $150 out of pocket. Coronary CalciumScan A coronary calcium scan is an imaging test used to look for deposits of calcium and other fatty materials (plaques) in the inner lining of the blood vessels of the heart (coronary arteries). These deposits of calcium and plaques can partly clog and narrow the coronary arteries without producing any  symptoms or warning signs. This puts a person at risk for a heart attack. This test can detect these deposits before symptoms develop. Tell a health care provider about:  Any allergies you have.  All medicines you are taking, including vitamins, herbs, eye drops, creams, and over-the-counter medicines.  Any problems you or family members have had with anesthetic medicines.  Any blood disorders you have.  Any surgeries you have had.  Any medical conditions you have.  Whether you are pregnant or may be pregnant. What are the risks? Generally, this is a safe procedure. However, problems may occur, including:  Harm to a pregnant woman and her unborn baby. This test involves the use of radiation. Radiation exposure can be dangerous to a pregnant woman and her unborn baby. If you are pregnant, you generally should not have this procedure done.  Slight increase in the risk of cancer. This is because of the radiation involved in the test. What happens before the procedure? No preparation is needed for this procedure. What happens during the procedure?  You will undress and remove any jewelry around your neck or chest.  You will put on a hospital gown.  Sticky electrodes will be placed on your chest. The electrodes will be connected to an electrocardiogram (ECG) machine to record a tracing of the electrical activity of your heart.  A CT scanner will take pictures of your heart. During this time, you will be asked to lie still and hold your breath for 2-3 seconds while a picture of your heart is being taken. The procedure may vary among health care providers and hospitals. What happens after  the procedure?  You can get dressed.  You can return to your normal activities.  It is up to you to get the results of your test. Ask your health care provider, or the department that is doing the test, when your results will be ready. Summary  A coronary calcium scan is an imaging test used to  look for deposits of calcium and other fatty materials (plaques) in the inner lining of the blood vessels of the heart (coronary arteries).  Generally, this is a safe procedure. Tell your health care provider if you are pregnant or may be pregnant.  No preparation is needed for this procedure.  A CT scanner will take pictures of your heart.  You can return to your normal activities after the scan is done. This information is not intended to replace advice given to you by your health care provider. Make sure you discuss any questions you have with your health care provider. Document Released: 02/25/2008 Document Revised: 07/18/2016 Document Reviewed: 07/18/2016 Elsevier Interactive Patient Education  2017 [employer].

## 2020-02-07 ENCOUNTER — Ambulatory Visit (INDEPENDENT_AMBULATORY_CARE_PROVIDER_SITE_OTHER)
Admission: RE | Admit: 2020-02-07 | Discharge: 2020-02-07 | Disposition: A | Discharge status: Home / Self Care | Payer: BC Managed Care – PPO | Source: Ambulatory Visit | Attending: Cardiology | Admitting: Cardiology

## 2020-02-07 ENCOUNTER — Other Ambulatory Visit: Payer: Self-pay

## 2020-02-07 DIAGNOSIS — R072 Precordial pain: Secondary | ICD-10-CM

## 2020-02-12 ENCOUNTER — Telehealth: Payer: Self-pay | Admitting: Cardiology

## 2020-02-12 NOTE — Telephone Encounter (Signed)
   Pt calling about his CT results.

## 2020-02-12 NOTE — Telephone Encounter (Signed)
Returned call to patient, advised MD to review.    He can see report on Mychart and is concerned.   He is scheduled for ETT on 6/15.   Advised would call with further results and recommendations once MD reviewed.  Patient aware.

## 2020-02-13 ENCOUNTER — Other Ambulatory Visit (HOSPITAL_BASED_OUTPATIENT_CLINIC_OR_DEPARTMENT_OTHER): Payer: Self-pay | Admitting: Family Medicine

## 2020-02-13 ENCOUNTER — Telehealth: Payer: Self-pay

## 2020-02-13 ENCOUNTER — Telehealth (HOSPITAL_COMMUNITY): Payer: Self-pay | Admitting: *Deleted

## 2020-02-13 DIAGNOSIS — R072 Precordial pain: Secondary | ICD-10-CM

## 2020-02-13 DIAGNOSIS — N50811 Right testicular pain: Secondary | ICD-10-CM

## 2020-02-13 DIAGNOSIS — E785 Hyperlipidemia, unspecified: Secondary | ICD-10-CM

## 2020-02-13 MED ORDER — METOPROLOL TARTRATE 100 MG PO TABS: 100.0000 mg | ORAL_TABLET | Freq: Once | ORAL | 0 refills | Status: DC

## 2020-02-13 MED ORDER — NITROGLYCERIN 0.4 MG SL SUBL: 0.4000 mg | SUBLINGUAL_TABLET | SUBLINGUAL | 3 refills | Status: DC | PRN

## 2020-02-13 MED ORDER — ROSUVASTATIN CALCIUM 20 MG PO TABS: 20.0000 mg | ORAL_TABLET | Freq: Every day | ORAL | 3 refills | Status: DC

## 2020-02-13 MED ORDER — ASPIRIN EC 81 MG PO TBEC: 81.0000 mg | DELAYED_RELEASE_TABLET | Freq: Every day | ORAL | 3 refills | Status: AC

## 2020-02-13 NOTE — Telephone Encounter (Signed)
Reaching out to patient to offer assistance regarding upcoming cardiac imaging study; pt verbalizes understanding of appt date/time, parking situation and where to check in, pre-test NPO status and medications ordered, and verified current allergies; name and call back number provided for further questions should they arise  [facility] [phone] office [phone] cell  Pt states that he has anxiety medication he could take for the exam.  Pt made aware that if he took anxiety medication that he would need a driver.  Pt expressed understanding.

## 2020-02-13 NOTE — Telephone Encounter (Signed)
Note copied from results per Dr. Percival Spanish -  Called and dicussed the results. He is not reporting new symptoms. He is anxious. Given the degree of calcium and the previous symptoms I would suggest that we not do a POET (Plain Old Exercise Treadmill) as the sensitivity and specificity is not high enough. He will need a coronary CTA. He should start ASA 81 mg daily and I would like for him to start Crestor 20 mg daily with a repeat lipid and liver profile in 8 weeks. Please call in prescription for SLNTG as well. He is to go to the ED with any recurrent or increased symptoms.   Patient contacted and made aware. POET cancelled. Lab slips mailed to patient.

## 2020-02-14 ENCOUNTER — Ambulatory Visit (HOSPITAL_COMMUNITY)
Admission: RE | Admit: 2020-02-14 | Discharge: 2020-02-14 | Disposition: A | Discharge status: Home / Self Care | Payer: BC Managed Care – PPO | Source: Ambulatory Visit | Attending: Cardiology | Admitting: Cardiology

## 2020-02-14 ENCOUNTER — Other Ambulatory Visit: Payer: Self-pay

## 2020-02-14 DIAGNOSIS — R072 Precordial pain: Secondary | ICD-10-CM | POA: Diagnosis not present

## 2020-02-14 MED ORDER — NITROGLYCERIN 0.4 MG SL SUBL
SUBLINGUAL_TABLET | SUBLINGUAL | Status: AC
  Administered 2020-02-14: 0.8 mg via SUBLINGUAL
  Filled 2020-02-14: qty 2

## 2020-02-14 MED ORDER — NITROGLYCERIN 0.4 MG SL SUBL: 0.8000 mg | SUBLINGUAL_TABLET | Freq: Once | SUBLINGUAL | Status: AC

## 2020-02-14 MED ORDER — IOHEXOL 350 MG/ML SOLN
80.0000 mL | Freq: Once | INTRAVENOUS | Status: AC | PRN
  Administered 2020-02-14: 80 mL via INTRAVENOUS

## 2020-02-16 DIAGNOSIS — I10 Essential (primary) hypertension: Secondary | ICD-10-CM

## 2020-02-16 DIAGNOSIS — R072 Precordial pain: Secondary | ICD-10-CM

## 2020-02-16 DIAGNOSIS — E785 Hyperlipidemia, unspecified: Secondary | ICD-10-CM

## 2020-02-18 ENCOUNTER — Other Ambulatory Visit: Payer: Self-pay

## 2020-02-18 ENCOUNTER — Ambulatory Visit (INDEPENDENT_AMBULATORY_CARE_PROVIDER_SITE_OTHER): Payer: BC Managed Care – PPO

## 2020-02-18 DIAGNOSIS — N50811 Right testicular pain: Secondary | ICD-10-CM

## 2020-02-18 DIAGNOSIS — N50812 Left testicular pain: Secondary | ICD-10-CM | POA: Diagnosis not present

## 2020-02-18 MED ORDER — ROSUVASTATIN CALCIUM 5 MG PO TABS: 20.0000 mg | ORAL_TABLET | Freq: Every day | ORAL | 0 refills | Status: DC

## 2020-02-18 NOTE — Telephone Encounter (Signed)
MESSAGE FROM DR. HOCHREIN: He wants to start his Crestor slowly taking 5 mg at a time and then increasing every couple of weeks till he gets to 20 mg. We will need to call in 5 mg Crestor as his prescriptions for the 20s. He needs to get a liver profile in about 4 weeks after starting has had he says he has had some fatty liver and elevated liver enzymes. I then would like to check a lipid and liver in about 8 weeks after he gets to his target dose of 20 mg daily. He should see me in follow-up as previously scheduled and he will need an exercise treadmill in 1 year.

## 2020-02-21 ENCOUNTER — Other Ambulatory Visit (HOSPITAL_COMMUNITY): Payer: BC Managed Care – PPO

## 2020-02-21 ENCOUNTER — Other Ambulatory Visit (HOSPITAL_BASED_OUTPATIENT_CLINIC_OR_DEPARTMENT_OTHER): Payer: BC Managed Care – PPO

## 2020-02-25 ENCOUNTER — Inpatient Hospital Stay (HOSPITAL_COMMUNITY): Admission: RE | Admit: 2020-02-25 | Payer: BC Managed Care – PPO | Source: Ambulatory Visit

## 2020-02-26 ENCOUNTER — Ambulatory Visit (HOSPITAL_COMMUNITY)
Admission: RE | Admit: 2020-02-26 | Discharge: 2020-02-26 | Disposition: A | Discharge status: Home / Self Care | Payer: BC Managed Care – PPO | Source: Ambulatory Visit | Attending: Cardiovascular Disease | Admitting: Cardiovascular Disease

## 2020-02-26 ENCOUNTER — Other Ambulatory Visit: Payer: Self-pay

## 2020-02-26 DIAGNOSIS — R1900 Intra-abdominal and pelvic swelling, mass and lump, unspecified site: Secondary | ICD-10-CM | POA: Diagnosis present

## 2020-04-07 ENCOUNTER — Ambulatory Visit: Payer: BC Managed Care – PPO | Admitting: Family Medicine

## 2020-04-14 ENCOUNTER — Encounter: Payer: Self-pay | Admitting: Family Medicine

## 2020-04-14 ENCOUNTER — Ambulatory Visit: Payer: BC Managed Care – PPO | Admitting: Family Medicine

## 2020-04-14 ENCOUNTER — Other Ambulatory Visit: Payer: Self-pay

## 2020-04-14 DIAGNOSIS — R0781 Pleurodynia: Secondary | ICD-10-CM

## 2020-04-14 DIAGNOSIS — M25512 Pain in left shoulder: Secondary | ICD-10-CM

## 2020-04-14 DIAGNOSIS — F319 Bipolar disorder, unspecified: Secondary | ICD-10-CM | POA: Insufficient documentation

## 2020-04-14 NOTE — Progress Notes (Signed)
Office Visit Note   Patient: Christopher Collins           Date of Birth: 29-Nov-1965           MRN: [id number] Visit Date: 04/14/2020 Requested by: Glenis Smoker, MD [place],  [place] [id number] PCP: Glenis Smoker, MD  Subjective: Chief Complaint  Patient presents with  . Left Shoulder - Pain    Was crawling in a crawl space 2 weeks ago, with outstretched arms. Pain in the shoulder. Aggravated it more 3 days ago, pulling up carpet. Hurts to lie on left side. Decreased ROM due to pain.  . right rib pain x 2 weeks - getting better    HPI: He is here with left shoulder pain.  A couple weeks ago he was in a crawl space, doing a lot of reaching overhead with his arm.  He started having pain in the anterior shoulder.  Is getting a little bit better, but still hurts when doing lifting activities.  3 days ago when pulling up a carpet, it aggravated his pain.  Also for the past 2 weeks he has had right rib area pain after leaning against some bricks in the crawl space.  He is working with Kym Groom for physical therapy.               ROS:   All other systems were reviewed and are negative.  Objective: Vital Signs: There were no vitals taken for this visit.  Physical Exam:  General:  Alert and oriented, in no acute distress. Pulm:  Breathing unlabored. Psy:  Normal mood, congruent affect.  Left shoulder: Full active range of motion.  He is tender near the [facility] joint and has pain with AC crossover test.  Isometric rotator cuff strength is 5/5 throughout.  He is tender in the lateral subacromial space.  He has pain with passive impingement, his range of motion is limited compared to the right mainly due to pain.  I do not think he has a true frozen shoulder. Right rib cage is tender in the anterior lateral aspect.  No crepitation or step-off.    Imaging: No results found.  Assessment & Plan: 1.  Left shoulder pain, suspect impingement. -He will  continue with physical therapy. -Trial of Voltaren gel topically. -Could contemplate subacromial injection if symptoms persist. -X-rays and MRI scan if he fails conservative management.     Procedures: No procedures performed  No notes on file     PMFS History: Patient Active Problem List   Diagnosis Date Noted  . Mild bipolar disorder ([place]) 04/14/2020  . Precordial pain 01/30/2020  . Essential hypertension 01/30/2020  . Educated about COVID-19 virus infection 01/30/2020  . Acoustic trauma of both ears 01/02/2020  . Dysgeusia 01/02/2020  . Tinnitus aurium, left 10/04/2019  . Laceration of left thumb without foreign body without damage to nail 08/30/2018  . Laryngopharyngeal reflux (LPR) 05/14/2014  . Pain, joint, knee, left 02/27/2014  . OSA on CPAP 08/28/2013  . Hypersomnia with sleep apnea, unspecified 08/28/2013  . Hypersomnia with sleep apnea   . Anxiety and depression 07/22/2013  . Gastrointestinal food sensitivity 07/22/2013  . Rotator cuff impingement syndrome 07/22/2013  . 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  . Vitamin D deficiency 07/22/2013  . Sensorineural hearing loss (SNHL) of both ears 05/10/2013  . Hypersomnia, persistent 04/17/2013  . Sleep apnea with use of continuous positive airway pressure (CPAP) 02/06/2013  . Pain in joint, ankle and foot  11/13/2012  . Abnormality of gait 11/13/2012   Past Medical History:  Diagnosis Date  . Arthritis    hands  . Bipolar disorder ([place])    states mild  . 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 Dental crown present    also dental cap - lower  . Depression   . Hypersomnia with sleep apnea   . Hypertension    under control with meds., has been on med. x 2 yr.  . Mucoid cyst of joint 03/2013   right middle finger  . Sleep apnea with use of continuous positive airway pressure (CPAP)     Family History  Problem Relation Age of Onset  . Lung cancer Father   . AAA (abdominal aortic aneurysm) Father   .  Heart attack Paternal Grandfather 7  . AAA (abdominal aortic aneurysm) Paternal Grandfather        Died from this age 16    Past Surgical History:  Procedure Laterality Date  . CHOLECYSTECTOMY  2000s  . FINGER SURGERY Right 03/2013   right middle finger-cyst removed  . TONSILLECTOMY AND ADENOIDECTOMY     as a child   Social History   Occupational History    Employer: [employer]    Comment: non shift worker  Tobacco Use  . Smoking status: Never Smoker  . Smokeless tobacco: Never Used  Substance and Sexual Activity  . Alcohol use: Yes    Comment: occasionally  . Drug use: No  . Sexual activity: Not on file

## 2020-04-15 ENCOUNTER — Encounter: Payer: Self-pay | Admitting: Family Medicine

## 2020-07-24 ENCOUNTER — Encounter: Payer: Self-pay | Admitting: Family Medicine

## 2020-07-24 ENCOUNTER — Ambulatory Visit: Payer: BC Managed Care – PPO | Admitting: Family Medicine

## 2020-07-24 ENCOUNTER — Ambulatory Visit: Payer: Self-pay

## 2020-07-24 ENCOUNTER — Other Ambulatory Visit: Payer: Self-pay

## 2020-07-24 DIAGNOSIS — M25512 Pain in left shoulder: Secondary | ICD-10-CM

## 2020-07-24 DIAGNOSIS — M25572 Pain in left ankle and joints of left foot: Secondary | ICD-10-CM

## 2020-07-24 NOTE — Progress Notes (Signed)
Office Visit Note   Patient: Christopher Collins           Date of Birth: 1966-05-10           MRN: [id number] Visit Date: 07/24/2020 Requested by: Glenis Smoker, MD [place],  [place] [id number] PCP: Glenis Smoker, MD  Subjective: Chief Complaint  Patient presents with  . Left Ankle - Pain    Pain has worsened over the past 2 weeks. NKI. Did help a friend move in October. Hurt his right knee, and so has been putting more weight on the left. Pain medial and anterior ankle. Swells.     HPI: He is here with multiple concerns.  His ankle started bothering him about 2 weeks ago, left ankle on the medial anterior aspect.  He helped a friend move and he thinks the repetitive stairclimbing might have been it.  His left thumb has been bothering him as well since moving.  He has arthritis in his fingers.  He wonders what he can do to help his thumb.  His left shoulder is better but still bothering him in both of his knees have been bothering him as well.                ROS:   All other systems were reviewed and are negative.  Objective: Vital Signs: There were no vitals taken for this visit.  Physical Exam:  General:  Alert and oriented, in no acute distress. Pulm:  Breathing unlabored. Psy:  Normal mood, congruent affect.  Left hand: He has some tenderness at the thumb CMC joint.  Good range of motion.  His fingers of both hands have DIP arthritic change. Left ankle: There is soft tissue swelling over the anterior medial joint line.  No erythema or warmth.  Good range of motion of the ankle, ligaments are stable.  No pain with resisted strength testing.   Imaging: XR Ankle Complete Left  Result Date: 07/24/2020 X-rays of the left ankle reveal no sign of stress fracture or DJD.  No acute abnormality.   Assessment & Plan: 1.  Left ankle impingement -ASO brace for comfort, Voltaren gel topically.  Follow-up as needed.  2.  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  3.  Shoulder pain, improving.  Bilateral knee pain.    Procedures: No procedures performed  No notes on file     PMFS History: Patient Active Problem List   Diagnosis Date Noted  . Mild bipolar disorder ([place]) 04/14/2020  . Precordial pain 01/30/2020  . Essential hypertension 01/30/2020  . Educated about COVID-19 virus infection 01/30/2020  . Acoustic trauma of both ears 01/02/2020  . Dysgeusia 01/02/2020  . Tinnitus aurium, left 10/04/2019  . Laceration of left thumb without foreign body without damage to nail 08/30/2018  . Laryngopharyngeal reflux (LPR) 05/14/2014  . Pain, joint, knee, left 02/27/2014  . OSA on CPAP 08/28/2013  . Hypersomnia with sleep apnea, unspecified 08/28/2013  . Hypersomnia with sleep apnea   . Anxiety and depression 07/22/2013  . Gastrointestinal food sensitivity 07/22/2013  . Rotator cuff impingement syndrome 07/22/2013  . 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  . Vitamin D deficiency 07/22/2013  . Sensorineural hearing loss (SNHL) of both ears 05/10/2013  . Hypersomnia, persistent 04/17/2013  . Sleep apnea with use of continuous positive airway pressure (CPAP) 02/06/2013  . Pain in joint, ankle and foot 11/13/2012  . Abnormality of gait 11/13/2012   Past Medical  History:  Diagnosis Date  . Arthritis    hands  . Bipolar disorder ([place])    states mild  . 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 Dental crown present    also dental cap - lower  . Depression   . Hypersomnia with sleep apnea   . Hypertension    under control with meds., has been on med. x 2 yr.  . Mucoid cyst of joint 03/2013   right middle finger  . Sleep apnea with use of continuous positive airway pressure (CPAP)     Family History  Problem Relation Age of Onset  . Lung cancer Father   . AAA (abdominal aortic aneurysm) Father   . Heart attack Paternal Grandfather 45  . AAA (abdominal aortic aneurysm)  Paternal Grandfather        Died from this age 69    Past Surgical History:  Procedure Laterality Date  . CHOLECYSTECTOMY  2000s  . FINGER SURGERY Right 03/2013   right middle finger-cyst removed  . TONSILLECTOMY AND ADENOIDECTOMY     as a child   Social History   Occupational History    Employer: [employer]    Comment: non shift worker  Tobacco Use  . Smoking status: Never Smoker  . Smokeless tobacco: Never Used  Substance and Sexual Activity  . Alcohol use: Yes    Comment: occasionally  . Drug use: No  . Sexual activity: Not on file

## 2020-07-28 ENCOUNTER — Encounter: Payer: Self-pay | Admitting: Family Medicine

## 2020-07-29 MED ORDER — DEXAMETHASONE SODIUM PHOSPHATE 4 MG/ML IJ SOLN
INTRAMUSCULAR | 6 refills | Status: DC
Start: 2020-07-29 — End: 2021-08-28

## 2020-07-29 NOTE — Addendum Note (Signed)
Addended by: Hortencia Pilar on: 07/29/2020 08:31 AM   Modules accepted: Orders

## 2020-08-04 ENCOUNTER — Ambulatory Visit: Payer: BC Managed Care – PPO | Attending: Internal Medicine

## 2020-08-04 DIAGNOSIS — Z23 Encounter for immunization: Secondary | ICD-10-CM

## 2020-08-04 NOTE — Progress Notes (Signed)
   Covid-19 Vaccination Clinic  Name:  Christopher Collins    MRN: [id number] DOB: 21-Jun-1966  08/04/2020  [name] was observed post Covid-19 immunization for 30 minutes based on pre-vaccination screening without incident. He was provided with Vaccine Information Sheet and instruction to access the V-Safe system.   [name] was instructed to call 911 with any severe reactions post vaccine: Marland Kitchen Difficulty breathing  . Swelling of face and throat  . A fast heartbeat  . A bad rash all over body  . Dizziness and weakness   Immunizations Administered    No immunizations on file.

## 2020-08-05 ENCOUNTER — Other Ambulatory Visit: Payer: Self-pay | Admitting: Family Medicine

## 2020-08-05 DIAGNOSIS — E559 Vitamin D deficiency, unspecified: Secondary | ICD-10-CM

## 2020-08-05 DIAGNOSIS — M25512 Pain in left shoulder: Secondary | ICD-10-CM

## 2020-08-05 DIAGNOSIS — M2550 Pain in unspecified joint: Secondary | ICD-10-CM

## 2020-08-05 DIAGNOSIS — M25572 Pain in left ankle and joints of left foot: Secondary | ICD-10-CM

## 2020-11-11 ENCOUNTER — Other Ambulatory Visit: Payer: Self-pay

## 2020-11-11 ENCOUNTER — Encounter: Payer: Self-pay | Admitting: Podiatry

## 2020-11-11 ENCOUNTER — Ambulatory Visit: Payer: BC Managed Care – PPO | Admitting: Podiatry

## 2020-11-11 ENCOUNTER — Telehealth: Payer: Self-pay | Admitting: *Deleted

## 2020-11-11 ENCOUNTER — Encounter: Payer: Self-pay | Admitting: Family Medicine

## 2020-11-11 DIAGNOSIS — L03031 Cellulitis of right toe: Secondary | ICD-10-CM | POA: Diagnosis not present

## 2020-11-11 DIAGNOSIS — B351 Tinea unguium: Secondary | ICD-10-CM

## 2020-11-11 NOTE — Telephone Encounter (Signed)
You send penlac

## 2020-11-11 NOTE — Telephone Encounter (Signed)
Patient is requesting an alternative if possible to  the neosporin presribed since he has hearing issues.He's trying to avoid anything toxic including topical. Please advise.

## 2020-11-12 ENCOUNTER — Encounter: Payer: Self-pay | Admitting: Podiatry

## 2020-11-12 ENCOUNTER — Other Ambulatory Visit: Payer: Self-pay | Admitting: Podiatry

## 2020-11-12 MED ORDER — CEPHALEXIN 500 MG PO CAPS: 500.0000 mg | ORAL_CAPSULE | Freq: Three times a day (TID) | ORAL | 0 refills | Status: DC

## 2020-11-12 NOTE — Progress Notes (Signed)
Subjective:  Patient ID: Christopher Collins, male    DOB: 05-04-1966,  MRN: [id number]  Chief Complaint  Patient presents with  . Nail Problem    Possible ingrown nail on right hallux and nail discoloration     55 y.o. male presents with the above complaint.  Patient presents with complaint of bilateral hallux and second digit onychomycosis.  Patient states that the nail discoloration has been going for quite some time.  Patient would like to discuss treatment options for fungus nail.  He also has secondary complaint of right medial border of the hallux digit ingrown.  Patient states he has done some over-the-counter therapy none of which has helped.  There are some redness around it.  He denies any other acute complaints.  He does not want to have it removed.  He would like to discuss treatment options for this.   Review of Systems: Negative except as noted in the HPI. Denies N/V/F/Ch.  Past Medical History:  Diagnosis Date  . Arthritis    hands  . Bipolar disorder ([place])    states mild  . 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 Dental crown present    also dental cap - lower  . Depression   . Hypersomnia with sleep apnea   . Hypertension    under control with meds., has been on med. x 2 yr.  . Mucoid cyst of joint 03/2013   right middle finger  . Sleep apnea with use of continuous positive airway pressure (CPAP)     Current Outpatient Medications:  .  ALPRAZolam (XANAX) 0.25 MG tablet, Take 0.125 mg by mouth 3 (three) times daily., Disp: , Rfl:  .  amLODipine (NORVASC) 5 MG tablet, Take 10 mg by mouth daily. , Disp: , Rfl:  .  AMLODIPINE BESYLATE PO, Take by mouth., Disp: , Rfl:  .  aspirin EC 81 MG tablet, Take 1 tablet (81 mg total) by mouth daily., Disp: 90 tablet, Rfl: 3 .  b complex vitamins capsule, Take 1 capsule by mouth daily., Disp: , Rfl:  .  busPIRone (BUSPAR) 15 MG tablet, TAKE AS DIRECTED UP TO 1 TABLET TWICE A DAY, Disp: , Rfl: 2 .  cephALEXin  (KEFLEX) 500 MG capsule, Take 1 capsule (500 mg total) by mouth 3 (three) times daily., Disp: 30 capsule, Rfl: 0 .  Cholecalciferol (VITAMIN D-1000 MAX ST) 1000 units tablet, Take by mouth., Disp: , Rfl:  .  Cholecalciferol (VITAMIN D3) 5000 UNITS CAPS, Take 10,000 Int'l Units by mouth daily. , Disp: , Rfl:  .  Cyanocobalamin (VITAMIN B-12) 1000 MCG SUBL, Take by mouth., Disp: , Rfl:  .  cyclobenzaprine (FLEXERIL) 10 MG tablet, Take 10 mg by mouth as needed. , Disp: , Rfl: 1 .  dexamethasone (DECADRON) 4 MG/ML injection, Apply as needed for iontophoresis, Disp: 30 mL, Rfl: 6 .  econazole nitrate 1 % cream, 1 APPLICATION TO AFFECTED AREA ONCE A DAY EXTERNALLY 30 DAYS, Disp: , Rfl: 0 .  HYDROcodone-acetaminophen (NORCO/VICODIN) 5-325 MG tablet, Take by mouth. (Patient not taking: Reported on 04/14/2020), Disp: , Rfl:  .  ketoconazole (NIZORAL) 2 % cream, 1 APPLICATION TO AFFECTED AREA AS NEEDED EXTERNALLY 30 DAYS (Patient not taking: Reported on 04/14/2020), Disp: , Rfl: 5 .  lamoTRIgine (LAMICTAL) 100 MG tablet, Take 125 mg by mouth daily. , Disp: , Rfl:  .  lisinopril (PRINIVIL,ZESTRIL) 10 MG tablet, Take 10 mg by mouth daily., Disp: , Rfl:  .  Magnesium  100 MG CAPS, Take by mouth., Disp: , Rfl:  .  mirtazapine (REMERON) 45 MG tablet, Take 45 mg by mouth at bedtime., Disp: , Rfl:  .  mometasone (NASONEX) 50 MCG/ACT nasal spray, Place 2 sprays into the nose daily. , Disp: , Rfl:  .  montelukast (SINGULAIR) 10 MG tablet, Take 1 tablet (10 mg total) by mouth daily., Disp: 30 tablet, Rfl: 0 .  nitroGLYCERIN (NITROSTAT) 0.4 MG SL tablet, Place 1 tablet (0.4 mg total) under the tongue every 5 (five) minutes as needed for chest pain., Disp: 25 tablet, Rfl: 3 .  pyridOXINE (VITAMIN B-6) 100 MG tablet, Take 100 mg by mouth daily., Disp: , Rfl:  .  rosuvastatin (CRESTOR) 5 MG tablet, Take 4 tablets (20 mg total) by mouth daily. MAY START AT 5MG  AND WORK UP TO 20MG ., Disp: 360 tablet, Rfl: 0 .  thiamine 100 MG  tablet, 1 tablet, Disp: , Rfl:  .  traZODone (DESYREL) 100 MG tablet, 100 mg. 50 -100 mg  as needed. (Patient not taking: No sig reported), Disp: , Rfl:  .  valACYclovir (VALTREX) 1000 MG tablet, Take 1,000 mg by mouth 3 (three) times daily., Disp: , Rfl:  .  Zinc 50 MG TABS, Take by mouth., Disp: , Rfl:   Social History   Tobacco Use  Smoking Status Never Smoker  Smokeless Tobacco Never Used    Allergies  Allergen Reactions  . Nuvigil [Armodafinil]     Anxiety and tinnitis 2014 after taking just 2 tabs.  . Lac Bovis Other (See Comments)    GI UPSET  . Smallpox Vaccine Other (See Comments)  . Doxycycline Other (See Comments)    unknown  . Gluten Meal Other (See Comments)    GI UPSET  . Milk-Related Compounds Other (See Comments)    GI UPSET   Objective:  There were no vitals filed for this visit. There is no height or weight on file to calculate BMI. Constitutional Well developed. Well nourished.  Vascular Dorsalis pedis pulses palpable bilaterally. Posterior tibial pulses palpable bilaterally. Capillary refill normal to all digits.  No cyanosis or clubbing noted. Pedal hair growth normal.  Neurologic Normal speech. Oriented to person, place, and time. Epicritic sensation to light touch grossly present bilaterally.  Dermatologic  discolored thickened dystrophic nail noted to the right hallux/bilateral hallux and second digit.  Ingrown noted to the medial border with some circumferential redness around the ingrown part.  No purulent drainage noted.  No other clinical signs of infection noted.  Orthopedic: Normal joint ROM without pain or crepitus bilaterally. No visible deformities. No bony tenderness.   Radiographs: None Assessment:   1. Paronychia of great toe, right   2. Nail fungus   3. Onychomycosis due to dermatophyte    Plan:  Patient was evaluated and treated and all questions answered.  Bilateral hallux and second digit onychomycosis -Educated the  patient on the etiology of onychomycosis and various treatment options associated with improving the fungal load.  I explained to the patient that there is 3 treatment options available to treat the onychomycosis including topical, p.o., laser treatment.  Patient has elected to undergo laser therapy.  I discussed with him that it can take 6-7 sessions.  Patient states understanding  Right hallux medial border ingrown -I discussed with the patient the etiology of ingrown and various treatment options were discussed.  At this time patient hesitant to remove the ingrown to give him complete relief.  For now patient would like to  discuss options to treat it conservatively.  He would like to just try to do antibiotics to see if it gives relief.  Patient will be placed on Keflex for skin and soft tissue prophylaxis. -If it continues to worsen or regress have asked him to come back and see me immediately and we can pursue ingrown nail procedure.   No follow-ups on file.

## 2020-11-13 ENCOUNTER — Ambulatory Visit (INDEPENDENT_AMBULATORY_CARE_PROVIDER_SITE_OTHER): Payer: BC Managed Care – PPO

## 2020-11-13 ENCOUNTER — Other Ambulatory Visit: Payer: Self-pay

## 2020-11-13 DIAGNOSIS — B351 Tinea unguium: Secondary | ICD-10-CM

## 2020-11-13 NOTE — Progress Notes (Signed)
Patient presents today for the 1st laser treatment. Diagnosed with mycotic nail infection by Dr. Posey Pronto.   Toenail most affected 1-5 bilateral.  All other systems are negative.  Nails were filed thin. Laser therapy was administered to 1-5 toenails bilateral and patient tolerated the treatment well. All safety precautions were in place.    Follow up in 4 weeks for laser # 2.  Picture of nails taken today to document visual progress

## 2020-12-14 ENCOUNTER — Ambulatory Visit (INDEPENDENT_AMBULATORY_CARE_PROVIDER_SITE_OTHER): Payer: BC Managed Care – PPO

## 2020-12-14 ENCOUNTER — Other Ambulatory Visit: Payer: Self-pay

## 2020-12-14 DIAGNOSIS — B351 Tinea unguium: Secondary | ICD-10-CM

## 2020-12-14 NOTE — Patient Instructions (Signed)

## 2020-12-14 NOTE — Progress Notes (Signed)
Patient presents today for the 2nd laser treatment. Diagnosed with mycotic nail infection by Dr. Posey Pronto.   Toenail most affected 1-5 bilateral.  All other systems are negative.  Nails were filed thin. Laser therapy was administered to 1-5 toenails bilateral and patient tolerated the treatment well. All safety precautions were in place.    Follow up in 4 weeks for laser # 3.

## 2021-01-14 ENCOUNTER — Other Ambulatory Visit (HOSPITAL_BASED_OUTPATIENT_CLINIC_OR_DEPARTMENT_OTHER): Payer: Self-pay

## 2021-01-14 ENCOUNTER — Other Ambulatory Visit: Payer: Self-pay

## 2021-01-14 ENCOUNTER — Encounter: Payer: Self-pay | Admitting: Podiatry

## 2021-01-14 ENCOUNTER — Ambulatory Visit: Payer: Self-pay | Attending: Internal Medicine

## 2021-01-14 DIAGNOSIS — Z23 Encounter for immunization: Secondary | ICD-10-CM

## 2021-01-14 MED ORDER — COVID-19 MRNA VACC (MODERNA) 100 MCG/0.5ML IM SUSP
INTRAMUSCULAR | 0 refills | Status: DC
  Filled 2021-01-14: qty 0.25, 1d supply, fill #0

## 2021-01-14 NOTE — Progress Notes (Addendum)
   Covid-19 Vaccination Clinic  Name:  Christopher Collins    MRN: [id number] DOB: 09/30/1965  01/14/2021  [name] was observed post Covid-19 immunization for 15 minutes (30 minutes) kw. without incident. He was provided with Vaccine Information Sheet and instruction to access the V-Safe system.   [name] was instructed to call 911 with any severe reactions post vaccine: Marland Kitchen Difficulty breathing  . Swelling of face and throat  . A fast heartbeat  . A bad rash all over body  . Dizziness and weakness   Immunizations Administered    Name Date Dose VIS Date Route   Moderna Covid-19 Booster Vaccine 01/14/2021  1:54 PM 0.25 mL 07/01/2020 Intramuscular   Manufacturer: Moderna   Lot: [id number]   [place]: 08676-195-09

## 2021-01-15 ENCOUNTER — Other Ambulatory Visit: Payer: Self-pay

## 2021-01-15 ENCOUNTER — Ambulatory Visit (INDEPENDENT_AMBULATORY_CARE_PROVIDER_SITE_OTHER): Payer: BC Managed Care – PPO

## 2021-01-15 DIAGNOSIS — B351 Tinea unguium: Secondary | ICD-10-CM

## 2021-01-15 NOTE — Progress Notes (Signed)
Patient presents today for the 3rd laser treatment. Diagnosed with mycotic nail infection by Dr. Posey Pronto.   Toenail most affected 1-5 bilateral.  All other systems are negative.  Nails were filed thin. Laser therapy was administered to 1-5 toenails bilateral and patient tolerated the treatment well. All safety precautions were in place.    Follow up in 6 weeks for laser # 4.

## 2021-01-21 ENCOUNTER — Other Ambulatory Visit: Payer: Self-pay | Admitting: *Deleted

## 2021-01-21 DIAGNOSIS — R072 Precordial pain: Secondary | ICD-10-CM

## 2021-02-16 ENCOUNTER — Telehealth (HOSPITAL_COMMUNITY): Payer: Self-pay | Admitting: *Deleted

## 2021-02-16 ENCOUNTER — Other Ambulatory Visit: Payer: Self-pay | Admitting: Family Medicine

## 2021-02-16 DIAGNOSIS — R209 Unspecified disturbances of skin sensation: Secondary | ICD-10-CM

## 2021-02-16 NOTE — Telephone Encounter (Signed)
Close encounter 

## 2021-02-17 ENCOUNTER — Ambulatory Visit (HOSPITAL_COMMUNITY)
Admission: RE | Admit: 2021-02-17 | Discharge: 2021-02-17 | Disposition: A | Discharge status: Home / Self Care | Payer: BC Managed Care – PPO | Source: Ambulatory Visit | Attending: Cardiovascular Disease | Admitting: Cardiovascular Disease

## 2021-02-17 ENCOUNTER — Other Ambulatory Visit: Payer: Self-pay

## 2021-02-17 DIAGNOSIS — I10 Essential (primary) hypertension: Secondary | ICD-10-CM | POA: Diagnosis not present

## 2021-02-17 DIAGNOSIS — R072 Precordial pain: Secondary | ICD-10-CM | POA: Insufficient documentation

## 2021-02-17 LAB — EXERCISE TOLERANCE TEST
Estimated workload: 11.7 METS
Exercise duration (min): 10 min
Exercise duration (sec): 4 s
MPHR: 166 {beats}/min
Peak HR: 130 {beats}/min
Percent HR: 78 %
Rest HR: 66 {beats}/min

## 2021-02-22 ENCOUNTER — Other Ambulatory Visit (HOSPITAL_BASED_OUTPATIENT_CLINIC_OR_DEPARTMENT_OTHER): Payer: Self-pay

## 2021-02-22 MED ORDER — ZOSTER VAC RECOMB ADJUVANTED 50 MCG/0.5ML IM SUSR
INTRAMUSCULAR | 0 refills | Status: DC
Start: 2021-02-22 — End: 2021-08-28
  Filled 2021-02-22: qty 1, 1d supply, fill #0

## 2021-02-26 ENCOUNTER — Ambulatory Visit (INDEPENDENT_AMBULATORY_CARE_PROVIDER_SITE_OTHER): Payer: BC Managed Care – PPO

## 2021-02-26 ENCOUNTER — Other Ambulatory Visit: Payer: Self-pay

## 2021-02-26 DIAGNOSIS — B351 Tinea unguium: Secondary | ICD-10-CM

## 2021-02-26 NOTE — Progress Notes (Signed)
Patient presents today for the 4th laser treatment. Diagnosed with mycotic nail infection by Dr. Posey Pronto.   Toenail most affected 1-5 bilateral.  All other systems are negative.  Nails were filed thin. Laser therapy was administered to 1-5 toenails bilateral and patient tolerated the treatment well. All safety precautions were in place.    Follow up in 6 weeks for laser # 5.

## 2021-03-02 ENCOUNTER — Other Ambulatory Visit: Payer: BC Managed Care – PPO

## 2021-03-05 ENCOUNTER — Encounter: Payer: Self-pay | Admitting: Family Medicine

## 2021-03-08 ENCOUNTER — Other Ambulatory Visit: Payer: Self-pay

## 2021-03-08 ENCOUNTER — Ambulatory Visit
Admission: RE | Admit: 2021-03-08 | Discharge: 2021-03-08 | Disposition: A | Discharge status: Home / Self Care | Payer: BC Managed Care – PPO | Source: Ambulatory Visit | Attending: Family Medicine | Admitting: Family Medicine

## 2021-03-08 DIAGNOSIS — R209 Unspecified disturbances of skin sensation: Secondary | ICD-10-CM

## 2021-03-08 NOTE — Telephone Encounter (Signed)
Holding for Terri 

## 2021-03-10 NOTE — Progress Notes (Deleted)
Cardiology Office Note   Date:  03/10/2021   ID:  Christopher Collins, DOB 03/14/1966, MRN [id number]  PCP:  Christopher Smoker, MD  Cardiologist:   None Referring:  Christopher Smoker, MD  No chief complaint on file.     History of Present Illness: Christopher Collins is a 55 y.o. male who presents for evaluation of chest pain.   He was in the ED for this.  I sent him for a POET (Plain Old Exercise Treadmill).  There was no evidence of ischemia.  ***   ***  ***  He has no prior cardiac history although he does have some risk factors.  His blood pressure has been labile.  He said he has been prediabetic.  He does have a family history.  He gets discomfort with sporadic periods happening several times a month.  He describes an uneasy feeling.  He has some discomfort in his chest that is mild.  This happens at rest.  He can't bring it on with activities.  What bothers him most the other day was some left arm numbness associated with this.  He has had associated cold sweats.  It is a left upper discomfort not going to his shoulder or his jaw.  He has not been overly active but he does some yard work.  He can't bring it on with that.   Past Medical History:  Diagnosis Date   Arthritis    hands   Bipolar disorder ([place])    states mild   Degenerative joint disease of hand 03/2013   right   Dental crown present    also dental cap - lower   Depression    Hypersomnia with sleep apnea    Hypertension    under control with meds., has been on med. x 2 yr.   Mucoid cyst of joint 03/2013   right middle finger   Sleep apnea with use of continuous positive airway pressure (CPAP)     Past Surgical History:  Procedure Laterality Date   CHOLECYSTECTOMY  2000s   FINGER SURGERY Right 03/2013   right middle finger-cyst removed   TONSILLECTOMY AND ADENOIDECTOMY     as a child     Current Outpatient Medications  Medication Sig Dispense Refill   ALPRAZolam (XANAX) 0.25 MG tablet  Take 0.125 mg by mouth 3 (three) times daily.     amLODipine (NORVASC) 5 MG tablet Take 10 mg by mouth daily.      AMLODIPINE BESYLATE PO Take by mouth.     aspirin EC 81 MG tablet Take 1 tablet (81 mg total) by mouth daily. 90 tablet 3   b complex vitamins capsule Take 1 capsule by mouth daily.     busPIRone (BUSPAR) 15 MG tablet TAKE AS DIRECTED UP TO 1 TABLET TWICE A DAY  2   cephALEXin (KEFLEX) 500 MG capsule Take 1 capsule (500 mg total) by mouth 3 (three) times daily. 30 capsule 0   Cholecalciferol (VITAMIN D-1000 MAX ST) 1000 units tablet Take by mouth.     Cholecalciferol (VITAMIN D3) 5000 UNITS CAPS Take 10,000 Int'l Units by mouth daily.      COVID-19 mRNA vaccine, Moderna, 100 MCG/0.5ML injection Inject into the muscle. 0.25 mL 0   Cyanocobalamin (VITAMIN B-12) 1000 MCG SUBL Take by mouth.     cyclobenzaprine (FLEXERIL) 10 MG tablet Take 10 mg by mouth as needed.   1   dexamethasone (DECADRON) 4 MG/ML injection Apply as  needed for iontophoresis 30 mL 6   econazole nitrate 1 % cream 1 APPLICATION TO AFFECTED AREA ONCE A DAY EXTERNALLY 30 DAYS  0   HYDROcodone-acetaminophen (NORCO/VICODIN) 5-325 MG tablet Take by mouth. (Patient not taking: Reported on 04/14/2020)     ketoconazole (NIZORAL) 2 % cream 1 APPLICATION TO AFFECTED AREA AS NEEDED EXTERNALLY 30 DAYS (Patient not taking: Reported on 04/14/2020)  5   lamoTRIgine (LAMICTAL) 100 MG tablet Take 125 mg by mouth daily.      lisinopril (PRINIVIL,ZESTRIL) 10 MG tablet Take 10 mg by mouth daily.     Magnesium 100 MG CAPS Take by mouth.     mirtazapine (REMERON) 45 MG tablet Take 45 mg by mouth at bedtime.     mometasone (NASONEX) 50 MCG/ACT nasal spray Place 2 sprays into the nose daily.      montelukast (SINGULAIR) 10 MG tablet Take 1 tablet (10 mg total) by mouth daily. 30 tablet 0   nitroGLYCERIN (NITROSTAT) 0.4 MG SL tablet Place 1 tablet (0.4 mg total) under the tongue every 5 (five) minutes as needed for chest pain. 25 tablet 3    pyridOXINE (VITAMIN B-6) 100 MG tablet Take 100 mg by mouth daily.     rosuvastatin (CRESTOR) 5 MG tablet Take 4 tablets (20 mg total) by mouth daily. MAY START AT 5MG  AND WORK UP TO 20MG . 360 tablet 0   thiamine 100 MG tablet 1 tablet     traZODone (DESYREL) 100 MG tablet 100 mg. 50 -100 mg  as needed. (Patient not taking: No sig reported)     valACYclovir (VALTREX) 1000 MG tablet Take 1,000 mg by mouth 3 (three) times daily.     Zinc 50 MG TABS Take by mouth.     Zoster Vaccine Adjuvanted [facility]) injection Inject into the muscle. 1 mL 0   No current facility-administered medications for this visit.    Allergies:   Nuvigil [armodafinil], Lac bovis, Smallpox vaccine, Doxycycline, Gluten meal, and Milk-related compounds    ROS:  Please see the history of present illness.   Otherwise, review of systems are positive for ***.   All other systems are reviewed and negative.    PHYSICAL EXAM: VS:  There were no vitals taken for this visit. , BMI There is no height or weight on file to calculate BMI. GENERAL:  Well appearing NECK:  No jugular venous distention, waveform within normal limits, carotid upstroke brisk and symmetric, no bruits, no thyromegaly LUNGS:  Clear to auscultation bilaterally CHEST:  Unremarkable HEART:  PMI not displaced or sustained,S1 and S2 within normal limits, no S3, no S4, no clicks, no rubs, *** murmurs ABD:  Flat, positive bowel sounds normal in frequency in pitch, no bruits, no rebound, no guarding, no midline pulsatile mass, no hepatomegaly, no splenomegaly EXT:  2 plus pulses throughout, no edema, no cyanosis no clubbing    ***GENERAL:  Well appearing HEENT:  Pupils equal round and reactive, fundi not visualized, oral mucosa unremarkable NECK:  No jugular venous distention, waveform within normal limits, carotid upstroke brisk and symmetric, no bruits, no thyromegaly LYMPHATICS:  No cervical, inguinal adenopathy LUNGS:  Clear to auscultation  bilaterally BACK:  No CVA tenderness CHEST:  Unremarkable HEART:  PMI not displaced or sustained,S1 and S2 within normal limits, no S3, no S4, no clicks, no rubs, no murmurs ABD:  Flat, positive bowel sounds normal in frequency in pitch, no bruits, no rebound, no guarding, possible midline pulsatile mass, no hepatomegaly, no splenomegaly EXT:  2 plus pulses throughout, no edema, no cyanosis no clubbing SKIN:  No rashes no nodules NEURO:  Cranial nerves II through XII grossly intact, motor grossly intact throughout PSYCH:  Cognitively intact, oriented to person place and time    EKG:  EKG is *** ordered today. The ekg ordered today demonstrates sinus rhythm, rate ***, axis within normal limits, intervals within normal limits, no acute ST.   Recent Labs: No results found for requested labs within last 8760 hours.    Lipid Panel No results found for: CHOL, TRIG, HDL, CHOLHDL, VLDL, LDLCALC, LDLDIRECT    Wt Readings from Last 3 Encounters:  01/31/20 222 lb 9.6 oz (101 kg)  01/29/20 214 lb (97.1 kg)  08/24/18 208 lb (94.3 kg)      Other studies Reviewed: Additional studies/ records that were reviewed today include: *** Review of the above records demonstrates:  Please see elsewhere in the note.     ASSESSMENT AND PLAN:  CHEST PAIN:   ***    Patient's chest pain is somewhat atypical but he does have risk factors. I will bring the patient back for a POET (Plain Old Exercise Test). This will allow me to screen for obstructive coronary disease, risk stratify and very importantly provide a prescription for exercise.  I will also do a coronary calcium score.  HTN: His blood pressure is  *** mildly elevated but he just had his amlodipine increased.  He will continue with following up his own blood pressures in a week conversing titration further as needed.   Current medicines are reviewed at length with the patient today.  The patient does not have concerns regarding medicines.  The  following changes have been made:   ***  Labs/ tests ordered today include: ***  No orders of the defined types were placed in this encounter.    Disposition:   FU with me ***   Signed, Minus Breeding, MD  03/10/2021 8:34 PM    [facility]

## 2021-03-11 ENCOUNTER — Ambulatory Visit: Payer: BC Managed Care – PPO | Admitting: Cardiology

## 2021-03-11 ENCOUNTER — Telehealth: Payer: Self-pay

## 2021-03-11 NOTE — Telephone Encounter (Signed)
Called patient, LVM to call back to reschedule appointment for today due to loss of power at [facility] office.  Left number to call back to discuss.

## 2021-03-12 ENCOUNTER — Ambulatory Visit: Payer: BC Managed Care – PPO | Admitting: Cardiology

## 2021-03-16 ENCOUNTER — Other Ambulatory Visit: Payer: Self-pay | Admitting: Family Medicine

## 2021-03-16 DIAGNOSIS — R748 Abnormal levels of other serum enzymes: Secondary | ICD-10-CM

## 2021-03-17 ENCOUNTER — Ambulatory Visit (INDEPENDENT_AMBULATORY_CARE_PROVIDER_SITE_OTHER): Payer: BC Managed Care – PPO

## 2021-03-17 ENCOUNTER — Other Ambulatory Visit: Payer: Self-pay

## 2021-03-17 DIAGNOSIS — M25512 Pain in left shoulder: Secondary | ICD-10-CM

## 2021-03-17 DIAGNOSIS — M25572 Pain in left ankle and joints of left foot: Secondary | ICD-10-CM

## 2021-03-17 DIAGNOSIS — M2550 Pain in unspecified joint: Secondary | ICD-10-CM

## 2021-03-17 DIAGNOSIS — E559 Vitamin D deficiency, unspecified: Secondary | ICD-10-CM

## 2021-03-17 NOTE — Progress Notes (Signed)
Lab visit: Vit D 25-hydroxy, uric acid, ESR, RF, CCP antibody, CRP & ANA per Dr. Junius Roads.

## 2021-03-19 ENCOUNTER — Telehealth: Payer: Self-pay | Admitting: Family Medicine

## 2021-03-19 DIAGNOSIS — M25572 Pain in left ankle and joints of left foot: Secondary | ICD-10-CM

## 2021-03-19 LAB — C-REACTIVE PROTEIN: CRP: 2.4 mg/L (ref <8.0)

## 2021-03-19 LAB — SEDIMENTATION RATE: Sed Rate: 2 mm/h (ref 0–20)

## 2021-03-19 LAB — CYCLIC CITRUL PEPTIDE ANTIBODY, IGG: Cyclic Citrullin Peptide Ab: <16 UNITS

## 2021-03-19 LAB — URIC ACID: Uric Acid, Serum: 5.3 mg/dL (ref 4.0–8.0)

## 2021-03-19 LAB — VITAMIN D 25 HYDROXY (VIT D DEFICIENCY, FRACTURES): Vit D, 25-Hydroxy: 39 ng/mL (ref 30–100)

## 2021-03-19 LAB — RHEUMATOID FACTOR: Rheumatoid fact SerPl-aCnc: <14 IU/mL (ref <14)

## 2021-03-19 LAB — ANA: Anti Nuclear Antibody (ANA): NEGATIVE

## 2021-03-19 NOTE — Telephone Encounter (Signed)
Bloodwork looks normal.

## 2021-03-26 ENCOUNTER — Other Ambulatory Visit: Payer: Self-pay

## 2021-03-26 ENCOUNTER — Ambulatory Visit: Payer: BC Managed Care – PPO | Admitting: Family Medicine

## 2021-03-26 ENCOUNTER — Encounter: Payer: Self-pay | Admitting: Family Medicine

## 2021-03-26 DIAGNOSIS — L989 Disorder of the skin and subcutaneous tissue, unspecified: Secondary | ICD-10-CM | POA: Diagnosis not present

## 2021-03-26 DIAGNOSIS — R21 Rash and other nonspecific skin eruption: Secondary | ICD-10-CM

## 2021-03-26 DIAGNOSIS — M25572 Pain in left ankle and joints of left foot: Secondary | ICD-10-CM

## 2021-03-26 MED ORDER — CLOTRIMAZOLE-BETAMETHASONE 1-0.05 % EX CREA: 1.0000 "application " | TOPICAL_CREAM | Freq: Two times a day (BID) | CUTANEOUS | 0 refills | Status: DC

## 2021-03-26 NOTE — Progress Notes (Signed)
Office Visit Note   Patient: Christopher Collins           Date of Birth: 04-21-1966           MRN: [id number] Visit Date: 03/26/2021 Requested by: Glenis Smoker, MD [address] [place],  [place] [address] PCP: Glenis Smoker, MD  Subjective: Chief Complaint  Patient presents with   Left Foot - Pain    Pain in the 4th toe of the left foot, rash on the top of the foot and feels like "an impingement in the foot." He is worried about circulation issues in the foot.     HPI: He is here with several concerns.  His left foot has been bothering him off and on.  He feels an intermittent numbness sensation in his feet bilaterally.  He also has pain in his fourth toe.  He has a rash on the top of his foot that does not seem to go away.  He also has some subcutaneous nodules in his left forearm that he was concerned about.  He has not been able to get into a dermatologist.  He has a history of coronary artery disease with elevated calcium score.  He has had borderline blood sugar readings but normal hemoglobin A1c.  He does note that if he eats gluten, his joints hurt significantly worse.                ROS:   All other systems were reviewed and are negative.  Objective: Vital Signs: There were no vitals taken for this visit.  Physical Exam:  General:  Alert and oriented, in no acute distress. Pulm:  Breathing unlabored. Psy:  Normal mood, congruent affect. Skin: There is slight erythema on the dorsum of his left foot.  On his left forearm he has 2 whitish lesions which look like actinic keratoses.  He has some small subcutaneous nodules palpable. Left foot: 2+ dorsalis pedis pulse and posterior tibial pulse.  Brisk capillary refill in the toes.  The fourth toe has probable DJD at the DIP joint.   Imaging: No results found.  Assessment & Plan: Left foot pain and toe pain, probably due to DJD.  He could have problems with the microvascular circulation given his  history of coronary artery disease. -He will focus on healthy lifestyle as much as possible to decrease his cardiac risk.  Dietary recommendations emailed to the patient.  2.  Rash of left foot -Trial of Lotrisone.  3.  Skin lesions of left forearm -Dermatology referral.     Procedures: No procedures performed        PMFS History: Patient Active Problem List   Diagnosis Date Noted   Mild bipolar disorder ([place]) 04/14/2020   Precordial pain 01/30/2020   Essential hypertension 01/30/2020   Educated about COVID-19 virus infection 01/30/2020   Acoustic trauma of both ears 01/02/2020   Dysgeusia 01/02/2020   Tinnitus aurium, left 10/04/2019   Laceration of left thumb without foreign body without damage to nail 08/30/2018   Laryngopharyngeal reflux (LPR) 05/14/2014   Pain, joint, knee, left 02/27/2014   OSA on CPAP 08/28/2013   Hypersomnia with sleep apnea, unspecified 08/28/2013   Hypersomnia with sleep apnea    Anxiety and depression 07/22/2013   Gastrointestinal food sensitivity 07/22/2013   Rotator cuff impingement syndrome 07/22/2013   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)    states mild   Degenerative joint disease of hand 03/2013   right   Dental crown present    also dental cap - lower   Depression    Hypersomnia with sleep apnea    Hypertension    under control with meds., has been on med. x 2 yr.   Mucoid cyst of joint 03/2013   right middle finger   Sleep apnea with use of continuous positive airway pressure (CPAP)     Family History  Problem Relation Age of Onset   Lung cancer Father    AAA  (abdominal aortic aneurysm) Father    Heart attack Paternal Grandfather 29   AAA (abdominal aortic aneurysm) Paternal Grandfather        Died from this age 41    Past Surgical History:  Procedure Laterality Date   CHOLECYSTECTOMY  2000s   FINGER SURGERY Right 03/2013   right middle finger-cyst removed   TONSILLECTOMY AND ADENOIDECTOMY     as a child   Social History   Occupational History    Employer: [employer]    Comment: non shift worker  Tobacco Use   Smoking status: Never   Smokeless tobacco: Never  Substance and Sexual Activity   Alcohol use: Yes    Comment: occasionally   Drug use: No   Sexual activity: Not on file

## 2021-04-09 ENCOUNTER — Other Ambulatory Visit: Payer: Self-pay

## 2021-04-09 ENCOUNTER — Ambulatory Visit (INDEPENDENT_AMBULATORY_CARE_PROVIDER_SITE_OTHER): Payer: BC Managed Care – PPO | Admitting: *Deleted

## 2021-04-09 DIAGNOSIS — B351 Tinea unguium: Secondary | ICD-10-CM

## 2021-04-09 NOTE — Progress Notes (Signed)
Patient presents today for the 5th laser treatment. Diagnosed with mycotic nail infection by Dr. Posey Pronto.   Toenail most affected nail is the hallux right, but have been treating all the nails. He is happy with the progress so far.  All other systems are negative.  Nails were filed thin. Laser therapy was administered to 1-5 toenails bilateral and patient tolerated the treatment well. All safety precautions were in place.    Follow up in 8 weeks for laser # 6

## 2021-04-10 NOTE — Addendum Note (Signed)
Addended by: Hortencia Pilar on: 04/10/2021 09:44 AM   Modules accepted: Orders

## 2021-04-12 ENCOUNTER — Ambulatory Visit
Admission: RE | Admit: 2021-04-12 | Discharge: 2021-04-12 | Disposition: A | Discharge status: Home / Self Care | Payer: BC Managed Care – PPO | Source: Ambulatory Visit | Attending: Family Medicine | Admitting: Family Medicine

## 2021-04-12 ENCOUNTER — Other Ambulatory Visit: Payer: Self-pay

## 2021-04-12 DIAGNOSIS — R748 Abnormal levels of other serum enzymes: Secondary | ICD-10-CM

## 2021-05-07 ENCOUNTER — Encounter: Payer: Self-pay | Admitting: *Deleted

## 2021-05-10 DIAGNOSIS — E785 Hyperlipidemia, unspecified: Secondary | ICD-10-CM | POA: Insufficient documentation

## 2021-05-10 NOTE — Progress Notes (Signed)
Cardiology Office Note   Date:  05/11/2021   ID:  AD GLAVE, DOB Nov 05, 1965, MRN [id number]  PCP:  Glenis Smoker, MD  Cardiologist:   None Referring:  Glenis Smoker, MD   Chief Complaint  Patient presents with   Elevated Coronary Calcium       History of Present Illness: Christopher Collins is a 55 y.o. male who presents for evaluation of chest pain.   He was in the ED for this.  There was no evidence of ischemia.    I sent him for a POET (Plain Old Exercise Treadmill) which was negative but inadequate for heart rate.  He returns for follow-up.  He has had no new cardiovascular complaints.  He denies any chest pressure, neck or arm discomfort.  He is had no new shortness of breath, PND or orthopnea.  Has had no palpitations, presyncope or syncope.  He has had no weight gain.  He has some very mild edema.  He has some occasional muscle twitching and so he had his dose of Crestor reduced to 15 mg.  He really has none of the symptoms other than occasional soreness when he moves his left arm or shoulder.  Of note he did have nonobstructive coronary disease on CT in 2021.  Past Medical History:  Diagnosis Date   Arthritis    hands   Bipolar disorder ([place])    states mild   Degenerative joint disease of hand 03/2013   right   Dental crown present    also dental cap - lower   Depression    Hypersomnia with sleep apnea    Hypertension    under control with meds., has been on med. x 2 yr.   Mucoid cyst of joint 03/2013   right middle finger   Sleep apnea with use of continuous positive airway pressure (CPAP)     Past Surgical History:  Procedure Laterality Date   CHOLECYSTECTOMY  2000s   FINGER SURGERY Right 03/2013   right middle finger-cyst removed   TONSILLECTOMY AND ADENOIDECTOMY     as a child     Current Outpatient Medications  Medication Sig Dispense Refill   ALPRAZolam (XANAX) 0.25 MG tablet Take 0.125 mg by mouth 3 (three) times  daily.     amLODipine (NORVASC) 5 MG tablet Take 10 mg by mouth daily.      aspirin EC 81 MG tablet Take 1 tablet (81 mg total) by mouth daily. 90 tablet 3   chlorhexidine (PERIDEX) 0.12 % solution 5 mLs 2 (two) times daily.     clotrimazole-betamethasone (LOTRISONE) cream Apply 1 application topically 2 (two) times daily. 30 g 0   COVID-19 mRNA vaccine, Moderna, 100 MCG/0.5ML injection Inject into the muscle. 0.25 mL 0   cyclobenzaprine (FLEXERIL) 10 MG tablet Take 10 mg by mouth as needed.   1   dexamethasone (DECADRON) 4 MG/ML injection Apply as needed for iontophoresis 30 mL 6   econazole nitrate 1 % cream 1 APPLICATION TO AFFECTED AREA ONCE A DAY EXTERNALLY 30 DAYS  0   Ginkgo Biloba 40 MG TABS Take 1 tablet by mouth daily.     ketoconazole (NIZORAL) 2 % cream 1 APPLICATION TO AFFECTED AREA AS NEEDED EXTERNALLY 30 DAYS  5   lamoTRIgine (LAMICTAL) 150 MG tablet Take 150 mg by mouth daily.     lisinopril (PRINIVIL,ZESTRIL) 10 MG tablet Take 10 mg by mouth daily.     mirtazapine (REMERON) 15 MG  tablet Take 7.5-15 mg by mouth at bedtime.     mirtazapine (REMERON) 45 MG tablet Take 45 mg by mouth at bedtime.     mometasone (NASONEX) 50 MCG/ACT nasal spray Place 2 sprays into the nose daily.      montelukast (SINGULAIR) 10 MG tablet Take 1 tablet (10 mg total) by mouth daily. 30 tablet 0   nitroGLYCERIN (NITROSTAT) 0.4 MG SL tablet Place 1 tablet (0.4 mg total) under the tongue every 5 (five) minutes as needed for chest pain. 25 tablet 3   PREVIDENT 5000 ENAMEL PROTECT 1.1-5 % GEL Place onto teeth as directed.     pyridOXINE (VITAMIN B-6) 100 MG tablet Take 100 mg by mouth daily.     rosuvastatin (CRESTOR) 10 MG tablet Take 10 mg by mouth daily.     thiamine 100 MG tablet 1 tablet     valACYclovir (VALTREX) 1000 MG tablet Take 1,000 mg by mouth 3 (three) times daily.     Zinc 50 MG TABS Take by mouth.     Zoster Vaccine Adjuvanted [facility]) injection Inject into the muscle. 1 mL 0   No  current facility-administered medications for this visit.    Allergies:   Nuvigil [armodafinil], Lac bovis, Smallpox vaccine, Doxycycline, Gluten meal, and Milk-related compounds    ROS:  Please see the history of present illness.   Otherwise, review of systems are positive for none.   All other systems are reviewed and negative.    PHYSICAL EXAM: VS:  BP 116/66 (BP Location: Right Arm, Patient Position: Sitting, Cuff Size: Normal)   Pulse 72   Ht 6' (1.829 m)   Wt 224 lb 9.6 oz (101.9 kg)   SpO2 97%   BMI 30.46 kg/m  , BMI Body mass index is 30.46 kg/m. GENERAL:  Well appearing NECK:  No jugular venous distention, waveform within normal limits, carotid upstroke brisk and symmetric, no bruits, no thyromegaly LUNGS:  Clear to auscultation bilaterally CHEST:  Unremarkable HEART:  PMI not displaced or sustained,S1 and S2 within normal limits, no S3, no S4, no clicks, no rubs, no murmurs ABD:  Flat, positive bowel sounds normal in frequency in pitch, no bruits, no rebound, no guarding, no midline pulsatile mass, no hepatomegaly, no splenomegaly EXT:  2 plus pulses throughout, no edema, no cyanosis no clubbing   EKG:  EKG is not ordered today.    Recent Labs: No results found for requested labs within last 8760 hours.    Lipid Panel No results found for: CHOL, TRIG, HDL, CHOLHDL, VLDL, LDLCALC, LDLDIRECT    Wt Readings from Last 3 Encounters:  05/11/21 224 lb 9.6 oz (101.9 kg)  01/31/20 222 lb 9.6 oz (101 kg)  01/29/20 214 lb (97.1 kg)      Other studies Reviewed: Additional studies/ records that were reviewed today include: POET (Plain Old Exercise Treadmill) Review of the above records demonstrates:  Please see elsewhere in the note.     ASSESSMENT AND PLAN:  ELEVATED CORONARY CALCIUM:   He had a negative but inadequate POET (Plain Old Exercise Treadmill).     He is not having any new symptoms.  He was able to go over 10 minutes on the stress test without any ST  changes.  Given this I think that further testing is not indicated then he and I had a very long discussion about this.  He knows that in the absence of unstable symptoms or high risk stress test findings data supports aggressive primary risk reduction.  HTN: His blood pressure is controlled.  No change in therapy.   RISK REDUCTION: His LDL was 64 with an HDL of 39.  No change in therapy.    Current medicines are reviewed at length with the patient today.  The patient does not have concerns regarding medicines.  The following changes have been made:  None  Labs/ tests ordered today include: None  No orders of the defined types were placed in this encounter.    Disposition:   FU with me 12 months.     Signed, Minus Breeding, MD  05/11/2021 10:04 PM    [place]

## 2021-05-11 ENCOUNTER — Other Ambulatory Visit: Payer: Self-pay

## 2021-05-11 ENCOUNTER — Encounter: Payer: Self-pay | Admitting: Cardiology

## 2021-05-11 ENCOUNTER — Ambulatory Visit: Payer: BC Managed Care – PPO | Admitting: Cardiology

## 2021-05-11 VITALS — BP 116/66 | HR 72 | Ht 72.0 in | Wt 224.6 lb

## 2021-05-11 DIAGNOSIS — R072 Precordial pain: Secondary | ICD-10-CM

## 2021-05-11 DIAGNOSIS — E785 Hyperlipidemia, unspecified: Secondary | ICD-10-CM | POA: Diagnosis not present

## 2021-05-11 DIAGNOSIS — I10 Essential (primary) hypertension: Secondary | ICD-10-CM

## 2021-05-11 NOTE — Patient Instructions (Signed)
Medication Instructions:   No changes  *If you need a refill on your cardiac medications before your next appointment, please call your pharmacy*   Lab Work: Not needed    Testing/Procedures:  Not needed  Follow-Up: At [facility], you and your health needs are our priority.  As part of our continuing mission to provide you with exceptional heart care, we have created designated Provider Care Teams.  These Care Teams include your primary Cardiologist (physician) and Advanced Practice Providers (APPs -  Physician Assistants and Nurse Practitioners) who all work together to provide you with the care you need, when you need it.     Your next appointment:   12 month(s)  The format for your next appointment:   In Person  Provider:   Minus Breeding, MD   Other Instructions     recommends you purchase some compression  socks/hose from [employer] in [place] ,[place]. You do not need an prescription to purchase the items.  Address  [address] [place], [place] [address]  Phone  [phone]   Compression   strength    8-15 mmHg X 15-20 mmHg                           20-30 mmHg  30-40 mmHg.  You may also try a medical supply store, department store (i.e.- [employer], [employer], [employer], specialty shoe stores ( shoe market), [employer] and [employer]) or  Chartered loss adjuster uniform store.

## 2021-05-30 ENCOUNTER — Encounter: Payer: Self-pay | Admitting: Podiatry

## 2021-06-04 ENCOUNTER — Other Ambulatory Visit: Payer: Self-pay

## 2021-06-04 ENCOUNTER — Ambulatory Visit (INDEPENDENT_AMBULATORY_CARE_PROVIDER_SITE_OTHER): Payer: BC Managed Care – PPO

## 2021-06-04 DIAGNOSIS — B351 Tinea unguium: Secondary | ICD-10-CM

## 2021-06-04 NOTE — Progress Notes (Signed)
Patient presents today for the 6th laser treatment. Diagnosed with mycotic nail infection by Dr. Posey Pronto.   Toenail most affected nail is the hallux right, but have been treating all the nails. He is happy with the progress so far.  All other systems are negative.  Nails were filed thin. Laser therapy was administered to 1-5 toenails bilateral and patient tolerated the treatment well. All safety precautions were in place.    Patient has completed the recommended laser treatments. However Dr. Posey Pronto would patient to her left Hallux treat every 4 weeks until nail has improved in appearance.

## 2021-06-04 NOTE — Patient Instructions (Signed)

## 2021-07-02 ENCOUNTER — Other Ambulatory Visit: Payer: Self-pay

## 2021-07-02 ENCOUNTER — Ambulatory Visit (INDEPENDENT_AMBULATORY_CARE_PROVIDER_SITE_OTHER): Payer: BC Managed Care – PPO | Admitting: *Deleted

## 2021-07-02 DIAGNOSIS — B351 Tinea unguium: Secondary | ICD-10-CM

## 2021-07-02 NOTE — Progress Notes (Signed)
Patient presents today for the 1st laser treatment for the left hallux. Diagnosed with mycotic nail infection by Dr. Posey Pronto.   Toenail most affected nail is the hallux.   All other systems are negative.  Nails were filed thin. Laser therapy was administered to 1st toenails bilateral and patient tolerated the treatment well. All safety precautions were in place.    Patient will follow up every 4 weeks until both hallux nails have improved, per Dr. Posey Pronto.

## 2021-07-30 ENCOUNTER — Ambulatory Visit (INDEPENDENT_AMBULATORY_CARE_PROVIDER_SITE_OTHER): Payer: Self-pay | Admitting: *Deleted

## 2021-07-30 ENCOUNTER — Other Ambulatory Visit: Payer: Self-pay

## 2021-07-30 DIAGNOSIS — B351 Tinea unguium: Secondary | ICD-10-CM

## 2021-07-30 NOTE — Progress Notes (Signed)
Patient presents today for the 2nd laser treatment for the left hallux. Diagnosed with mycotic nail infection by Dr. Posey Pronto.   Toenail most affected nail is the hallux bilateral.  ~Trimmed all the nails today, at patient's request~  All other systems are negative.  Nails were filed thin. Laser therapy was administered to 1st toenails bilateral and patient tolerated the treatment well. All safety precautions were in place.    Patient will follow up every 4 weeks until both hallux nails have improved, per Dr. Posey Pronto.

## 2021-08-23 ENCOUNTER — Other Ambulatory Visit (HOSPITAL_BASED_OUTPATIENT_CLINIC_OR_DEPARTMENT_OTHER): Payer: Self-pay | Admitting: Family Medicine

## 2021-08-23 DIAGNOSIS — R1031 Right lower quadrant pain: Secondary | ICD-10-CM

## 2021-08-24 ENCOUNTER — Emergency Department (HOSPITAL_COMMUNITY): Payer: BC Managed Care – PPO | Admitting: Anesthesiology

## 2021-08-24 ENCOUNTER — Ambulatory Visit (HOSPITAL_BASED_OUTPATIENT_CLINIC_OR_DEPARTMENT_OTHER)
Admission: EM | Admit: 2021-08-24 | Discharge: 2021-08-24 | Disposition: A | Discharge status: Home / Self Care | Payer: BC Managed Care – PPO | Attending: Emergency Medicine | Admitting: Emergency Medicine

## 2021-08-24 ENCOUNTER — Ambulatory Visit (HOSPITAL_BASED_OUTPATIENT_CLINIC_OR_DEPARTMENT_OTHER)
Admission: RE | Admit: 2021-08-24 | Discharge: 2021-08-24 | Disposition: A | Discharge status: Home / Self Care | Payer: BC Managed Care – PPO | Source: Ambulatory Visit | Attending: Family Medicine | Admitting: Family Medicine

## 2021-08-24 ENCOUNTER — Encounter (HOSPITAL_BASED_OUTPATIENT_CLINIC_OR_DEPARTMENT_OTHER): Payer: Self-pay | Admitting: Obstetrics and Gynecology

## 2021-08-24 ENCOUNTER — Other Ambulatory Visit: Payer: Self-pay

## 2021-08-24 ENCOUNTER — Encounter (HOSPITAL_BASED_OUTPATIENT_CLINIC_OR_DEPARTMENT_OTHER): Payer: Self-pay

## 2021-08-24 ENCOUNTER — Encounter (HOSPITAL_COMMUNITY)
Admission: EM | Disposition: A | Discharge status: Home / Self Care | Payer: Self-pay | Source: Home / Self Care | Attending: Emergency Medicine

## 2021-08-24 DIAGNOSIS — R1031 Right lower quadrant pain: Secondary | ICD-10-CM

## 2021-08-24 DIAGNOSIS — K3589 Other acute appendicitis without perforation or gangrene: Secondary | ICD-10-CM | POA: Diagnosis not present

## 2021-08-24 DIAGNOSIS — Z20822 Contact with and (suspected) exposure to covid-19: Secondary | ICD-10-CM | POA: Diagnosis not present

## 2021-08-24 DIAGNOSIS — K3580 Unspecified acute appendicitis: Secondary | ICD-10-CM

## 2021-08-24 DIAGNOSIS — Z9049 Acquired absence of other specified parts of digestive tract: Secondary | ICD-10-CM | POA: Diagnosis not present

## 2021-08-24 HISTORY — PX: LAPAROSCOPIC APPENDECTOMY: SHX408

## 2021-08-24 LAB — COMPREHENSIVE METABOLIC PANEL
ALT: 26 U/L (ref 0–44)
AST: 11 U/L — ABNORMAL LOW (ref 15–41)
Albumin: 4.6 g/dL (ref 3.5–5.0)
Alkaline Phosphatase: 62 U/L (ref 38–126)
Anion gap: 11 (ref 5–15)
BUN: 11 mg/dL (ref 6–20)
CO2: 25 mmol/L (ref 22–32)
Calcium: 9.2 mg/dL (ref 8.9–10.3)
Chloride: 101 mmol/L (ref 98–111)
Creatinine, Ser: 0.88 mg/dL (ref 0.61–1.24)
GFR, Estimated: >60 mL/min (ref >60)
Glucose, Bld: 99 mg/dL (ref 70–99)
Potassium: 3.4 mmol/L — ABNORMAL LOW (ref 3.5–5.1)
Sodium: 137 mmol/L (ref 135–145)
Total Bilirubin: 1.3 mg/dL — ABNORMAL HIGH (ref 0.3–1.2)
Total Protein: 7.3 g/dL (ref 6.5–8.1)

## 2021-08-24 LAB — CBC WITH DIFFERENTIAL/PLATELET
Abs Immature Granulocytes: 0.06 10*3/uL (ref 0.00–0.07)
Basophils Absolute: 0.1 10*3/uL (ref 0.0–0.1)
Basophils Relative: 0 %
Eosinophils Absolute: 0.2 10*3/uL (ref 0.0–0.5)
Eosinophils Relative: 1 %
HCT: 42.8 % (ref 39.0–52.0)
Hemoglobin: 15.4 g/dL (ref 13.0–17.0)
Immature Granulocytes: 0 %
Lymphocytes Relative: 17 %
Lymphs Abs: 2.4 10*3/uL (ref 0.7–4.0)
MCH: 31.8 pg (ref 26.0–34.0)
MCHC: 36 g/dL (ref 30.0–36.0)
MCV: 88.2 fL (ref 80.0–100.0)
Monocytes Absolute: 1.8 10*3/uL — ABNORMAL HIGH (ref 0.1–1.0)
Monocytes Relative: 12 %
Neutro Abs: 9.9 10*3/uL — ABNORMAL HIGH (ref 1.7–7.7)
Neutrophils Relative %: 70 %
Platelets: 256 10*3/uL (ref 150–400)
RBC: 4.85 MIL/uL (ref 4.22–5.81)
RDW: 12.5 % (ref 11.5–15.5)
WBC: 14.3 10*3/uL — ABNORMAL HIGH (ref 4.0–10.5)
nRBC: 0 % (ref 0.0–0.2)

## 2021-08-24 LAB — POCT I-STAT CREATININE: Creatinine, Ser: 1 mg/dL (ref 0.61–1.24)

## 2021-08-24 LAB — RESP PANEL BY RT-PCR (FLU A&B, COVID) ARPGX2
Influenza A by PCR: NEGATIVE
Influenza B by PCR: NEGATIVE
SARS Coronavirus 2 by RT PCR: NEGATIVE

## 2021-08-24 SURGERY — APPENDECTOMY, LAPAROSCOPIC
Anesthesia: General | Site: Abdomen

## 2021-08-24 MED ORDER — SODIUM CHLORIDE 0.9 % IR SOLN
Status: DC | PRN
  Administered 2021-08-24: 1000 mL

## 2021-08-24 MED ORDER — SODIUM CHLORIDE 0.9 % IV SOLN
2.0000 g | Freq: Once | INTRAVENOUS | Status: AC
  Administered 2021-08-24: 2 g via INTRAVENOUS
  Filled 2021-08-24: qty 20

## 2021-08-24 MED ORDER — SUGAMMADEX SODIUM 200 MG/2ML IV SOLN
INTRAVENOUS | Status: DC | PRN
  Administered 2021-08-24: 200 mg via INTRAVENOUS

## 2021-08-24 MED ORDER — IOHEXOL 300 MG/ML  SOLN
100.0000 mL | Freq: Once | INTRAMUSCULAR | Status: AC | PRN
  Administered 2021-08-24: 100 mL via INTRAVENOUS

## 2021-08-24 MED ORDER — TRAMADOL HCL 50 MG PO TABS: 50.0000 mg | ORAL_TABLET | Freq: Four times a day (QID) | ORAL | 0 refills | Status: DC | PRN

## 2021-08-24 MED ORDER — FENTANYL CITRATE (PF) 100 MCG/2ML IJ SOLN
INTRAMUSCULAR | Status: DC | PRN
  Administered 2021-08-24: 100 ug via INTRAVENOUS
  Administered 2021-08-24 (×2): 50 ug via INTRAVENOUS

## 2021-08-24 MED ORDER — ONDANSETRON HCL 4 MG PO TABS: 4.0000 mg | ORAL_TABLET | Freq: Every day | ORAL | 1 refills | Status: AC | PRN

## 2021-08-24 MED ORDER — ACETAMINOPHEN 10 MG/ML IV SOLN
INTRAVENOUS | Status: AC
  Filled 2021-08-24: qty 100

## 2021-08-24 MED ORDER — BUPIVACAINE HCL 0.25 % IJ SOLN
INTRAMUSCULAR | Status: DC | PRN
  Administered 2021-08-24: 7 mL

## 2021-08-24 MED ORDER — CHLORHEXIDINE GLUCONATE 0.12 % MT SOLN
15.0000 mL | Freq: Once | OROMUCOSAL | Status: DC
  Filled 2021-08-24: qty 15

## 2021-08-24 MED ORDER — PHENYLEPHRINE HCL (PRESSORS) 10 MG/ML IV SOLN
INTRAVENOUS | Status: DC | PRN
  Administered 2021-08-24 (×2): 160 ug via INTRAVENOUS
  Administered 2021-08-24: 80 ug via INTRAVENOUS

## 2021-08-24 MED ORDER — MIDAZOLAM HCL 5 MG/5ML IJ SOLN
INTRAMUSCULAR | Status: DC | PRN
  Administered 2021-08-24: 2 mg via INTRAVENOUS

## 2021-08-24 MED ORDER — ONDANSETRON HCL 4 MG/2ML IJ SOLN
INTRAMUSCULAR | Status: DC | PRN
  Administered 2021-08-24: 4 mg via INTRAVENOUS

## 2021-08-24 MED ORDER — SUCCINYLCHOLINE CHLORIDE 200 MG/10ML IV SOSY
PREFILLED_SYRINGE | INTRAVENOUS | Status: DC | PRN
  Administered 2021-08-24: 120 mg via INTRAVENOUS

## 2021-08-24 MED ORDER — PROPOFOL 10 MG/ML IV BOLUS
INTRAVENOUS | Status: DC | PRN
  Administered 2021-08-24: 200 mg via INTRAVENOUS

## 2021-08-24 MED ORDER — FENTANYL CITRATE (PF) 100 MCG/2ML IJ SOLN
25.0000 ug | INTRAMUSCULAR | Status: DC | PRN
Start: 2021-08-24 — End: 2021-08-25
  Administered 2021-08-24: 25 ug via INTRAVENOUS

## 2021-08-24 MED ORDER — ACETAMINOPHEN 10 MG/ML IV SOLN
INTRAVENOUS | Status: DC | PRN
  Administered 2021-08-24: 1000 mg via INTRAVENOUS

## 2021-08-24 MED ORDER — ORAL CARE MOUTH RINSE: 15.0000 mL | Freq: Once | OROMUCOSAL | Status: DC

## 2021-08-24 MED ORDER — DEXAMETHASONE SODIUM PHOSPHATE 4 MG/ML IJ SOLN
INTRAMUSCULAR | Status: DC | PRN
  Administered 2021-08-24: 4 mg via INTRAVENOUS

## 2021-08-24 MED ORDER — AMISULPRIDE (ANTIEMETIC) 5 MG/2ML IV SOLN
INTRAVENOUS | Status: AC
  Filled 2021-08-24: qty 4

## 2021-08-24 MED ORDER — BUPIVACAINE HCL (PF) 0.25 % IJ SOLN
INTRAMUSCULAR | Status: AC
  Filled 2021-08-24: qty 30

## 2021-08-24 MED ORDER — METRONIDAZOLE 500 MG/100ML IV SOLN
500.0000 mg | Freq: Once | INTRAVENOUS | Status: AC
  Administered 2021-08-24: 500 mg via INTRAVENOUS
  Filled 2021-08-24: qty 100

## 2021-08-24 MED ORDER — EPHEDRINE SULFATE 50 MG/ML IJ SOLN
INTRAMUSCULAR | Status: DC | PRN
  Administered 2021-08-24: 10 mg via INTRAVENOUS

## 2021-08-24 MED ORDER — SODIUM CHLORIDE 0.9 % IV SOLN: INTRAVENOUS | Status: DC | PRN

## 2021-08-24 MED ORDER — FENTANYL CITRATE (PF) 100 MCG/2ML IJ SOLN
INTRAMUSCULAR | Status: AC
  Filled 2021-08-24: qty 2

## 2021-08-24 MED ORDER — ROCURONIUM BROMIDE 100 MG/10ML IV SOLN
INTRAVENOUS | Status: DC | PRN
Start: 2021-08-24 — End: 2021-08-24
  Administered 2021-08-24: 50 mg via INTRAVENOUS

## 2021-08-24 MED ORDER — LACTATED RINGERS IV SOLN: INTRAVENOUS | Status: DC

## 2021-08-24 MED ORDER — 0.9 % SODIUM CHLORIDE (POUR BTL) OPTIME
TOPICAL | Status: DC | PRN
  Administered 2021-08-24: 1000 mL

## 2021-08-24 MED ORDER — LIDOCAINE HCL (CARDIAC) PF 100 MG/5ML IV SOSY
PREFILLED_SYRINGE | INTRAVENOUS | Status: DC | PRN
  Administered 2021-08-24: 60 mg via INTRAVENOUS

## 2021-08-24 MED ORDER — AMISULPRIDE (ANTIEMETIC) 5 MG/2ML IV SOLN
10.0000 mg | Freq: Once | INTRAVENOUS | Status: AC | PRN
  Administered 2021-08-24: 10 mg via INTRAVENOUS

## 2021-08-24 SURGICAL SUPPLY — 46 items
ADH SKN CLS APL DERMABOND .7 (GAUZE/BANDAGES/DRESSINGS) ×1
APL PRP STRL LF DISP 70% ISPRP (MISCELLANEOUS) ×1
APPLIER CLIP 5 13 M/L LIGAMAX5 (MISCELLANEOUS)
APR CLP MED LRG 5 ANG JAW (MISCELLANEOUS)
BAG COUNTER SPONGE SURGICOUNT (BAG) ×2 IMPLANT
BAG SPNG CNTER NS LX DISP (BAG) ×1
BLADE CLIPPER SURG (BLADE) ×1 IMPLANT
CANISTER SUCT 3000ML PPV (MISCELLANEOUS) ×2 IMPLANT
CHLORAPREP W/TINT 26 (MISCELLANEOUS) ×2 IMPLANT
CLIP APPLIE 5 13 M/L LIGAMAX5 (MISCELLANEOUS) IMPLANT
CLIP LIGATING HEMO LOK XL GOLD (MISCELLANEOUS) ×2 IMPLANT
COVER SURGICAL LIGHT HANDLE (MISCELLANEOUS) ×2 IMPLANT
COVER TRANSDUCER ULTRASND (DRAPES) ×2 IMPLANT
DERMABOND ADVANCED (GAUZE/BANDAGES/DRESSINGS) ×1
DERMABOND ADVANCED .7 DNX12 (GAUZE/BANDAGES/DRESSINGS) ×1 IMPLANT
ELECT REM PT RETURN 9FT ADLT (ELECTROSURGICAL) ×2
ELECTRODE REM PT RTRN 9FT ADLT (ELECTROSURGICAL) ×1 IMPLANT
ENDOLOOP SUT PDS II  0 18 (SUTURE)
ENDOLOOP SUT PDS II 0 18 (SUTURE) IMPLANT
GLOVE SURG ENC MOIS LTX SZ7.5 (GLOVE) ×2 IMPLANT
GOWN STRL REUS W/ TWL LRG LVL3 (GOWN DISPOSABLE) ×2 IMPLANT
GOWN STRL REUS W/ TWL XL LVL3 (GOWN DISPOSABLE) ×1 IMPLANT
GOWN STRL REUS W/TWL LRG LVL3 (GOWN DISPOSABLE) ×2
GOWN STRL REUS W/TWL XL LVL3 (GOWN DISPOSABLE) ×2
GRASPER SUT TROCAR 14GX15 (MISCELLANEOUS) ×2 IMPLANT
KIT BASIN OR (CUSTOM PROCEDURE TRAY) ×2 IMPLANT
KIT TURNOVER KIT B (KITS) ×2 IMPLANT
NDL INSUFFLATION 14GA 120MM (NEEDLE) ×1 IMPLANT
NEEDLE INSUFFLATION 14GA 120MM (NEEDLE) ×2 IMPLANT
NS IRRIG 1000ML POUR BTL (IV SOLUTION) ×2 IMPLANT
PAD ARMBOARD 7.5X6 YLW CONV (MISCELLANEOUS) ×4 IMPLANT
SCISSORS LAP 5X35 DISP (ENDOMECHANICALS) ×2 IMPLANT
SET IRRIG TUBING LAPAROSCOPIC (IRRIGATION / IRRIGATOR) ×2 IMPLANT
SET TUBE SMOKE EVAC HIGH FLOW (TUBING) ×2 IMPLANT
SLEEVE ENDOPATH XCEL 5M (ENDOMECHANICALS) ×2 IMPLANT
SPECIMEN JAR SMALL (MISCELLANEOUS) ×2 IMPLANT
SUT MNCRL AB 4-0 PS2 18 (SUTURE) ×2 IMPLANT
TOWEL GREEN STERILE (TOWEL DISPOSABLE) ×2 IMPLANT
TOWEL GREEN STERILE FF (TOWEL DISPOSABLE) ×2 IMPLANT
TRAY FOLEY W/BAG SLVR 16FR (SET/KITS/TRAYS/PACK)
TRAY FOLEY W/BAG SLVR 16FR ST (SET/KITS/TRAYS/PACK) ×1 IMPLANT
TRAY LAPAROSCOPIC MC (CUSTOM PROCEDURE TRAY) ×2 IMPLANT
TROCAR XCEL NON-BLD 11X100MML (ENDOMECHANICALS) ×2 IMPLANT
TROCAR XCEL NON-BLD 5MMX100MML (ENDOMECHANICALS) ×2 IMPLANT
WARMER LAPAROSCOPE (MISCELLANEOUS) ×2 IMPLANT
WATER STERILE IRR 1000ML POUR (IV SOLUTION) ×2 IMPLANT

## 2021-08-24 NOTE — H&P (Signed)
Christopher Collins is an 55 y.o. male.   Chief Complaint: Abdominal pain HPI: Patient is a 55 year old male who comes in with several day history of abdominal pain.  Patient states the pain was in the right lower quadrant area.  Patient was seen by his PCP yesterday for a physical.  He discussed his symptoms at that time.  Patient had no nausea or vomiting.  Patient had no signs of diarrhea.  Patient states he had subjective fevers.  Patient underwent CT scan today secondary to abdominal pain.  CT scan was significant for acute appendicitis.  Patient also with mild leukocytosis.  Patient was then transferred to [facility] for further evaluation and management.  Patient with a history of cholecystectomy in the past.  Patient given antibiotics at outside hospital.  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 x 2 yr.   Mucoid cyst of joint 03/2013   right middle finger   Sleep apnea with use of continuous positive airway pressure (CPAP)     Past Surgical History:  Procedure Laterality Date   CHOLECYSTECTOMY  2000s   FINGER SURGERY Right 03/2013   right middle finger-cyst removed   TONSILLECTOMY AND ADENOIDECTOMY     as a child    Family History  Problem Relation Age of Onset   Lung cancer Father    AAA (abdominal aortic aneurysm) Father    Heart attack Paternal Grandfather 80   AAA (abdominal aortic aneurysm) Paternal Grandfather        Died from this age 28   Social History:  reports that he has never smoked. He has been exposed to tobacco smoke. He has never used smokeless tobacco. He reports that he does not currently use alcohol. He reports that he does not use drugs.  Allergies:  Allergies  Allergen  Reactions   Nuvigil [Armodafinil]     Anxiety and tinnitis 2014 after taking just 2 tabs.   Lac Bovis Other (See Comments)    GI UPSET   Smallpox Vaccine Other (See Comments)   Doxycycline Other (See Comments)    unknown   Gluten Meal Other (See Comments)    GI UPSET   Milk-Related Compounds Other (See Comments)    GI UPSET    Medications Prior to Admission  Medication Sig Dispense Refill   ALPRAZolam (XANAX) 0.25 MG tablet Take 0.125 mg by mouth 3 (three) times daily.     amLODipine (NORVASC) 5 MG tablet Take 10 mg by mouth daily.      aspirin EC 81 MG tablet Take 1 tablet (81 mg total) by mouth daily. 90 tablet 3   chlorhexidine (PERIDEX) 0.12 % solution 5 mLs 2 (two) times daily.     clotrimazole-betamethasone (LOTRISONE) cream Apply 1 application topically 2 (two) times daily. 30 g 0   COVID-19 mRNA vaccine, Moderna, 100 MCG/0.5ML injection Inject into the muscle. 0.25 mL 0   cyclobenzaprine (FLEXERIL) 10 MG tablet Take 10 mg by mouth as needed.   1   dexamethasone (DECADRON) 4 MG/ML injection Apply as needed for iontophoresis 30 mL 6   econazole nitrate 1 % cream 1  APPLICATION TO AFFECTED AREA ONCE A DAY EXTERNALLY 30 DAYS  0   Ginkgo Biloba 40 MG TABS Take 1 tablet by mouth daily.     ketoconazole (NIZORAL) 2 % cream 1 APPLICATION TO AFFECTED AREA AS NEEDED EXTERNALLY 30 DAYS  5   lamoTRIgine (LAMICTAL) 150 MG tablet Take 150 mg by mouth daily.     lisinopril (PRINIVIL,ZESTRIL) 10 MG tablet Take 10 mg by mouth daily.     mirtazapine (REMERON) 15 MG tablet Take 7.5-15 mg by mouth at bedtime.     mirtazapine (REMERON) 45 MG tablet Take 45 mg by mouth at bedtime.     mometasone (NASONEX) 50 MCG/ACT nasal spray Place 2 sprays into the nose daily.      montelukast (SINGULAIR) 10 MG tablet Take 1 tablet (10 mg total) by mouth daily. 30 tablet 0   nitroGLYCERIN (NITROSTAT) 0.4 MG SL tablet Place 1 tablet (0.4 mg total) under the tongue every 5 (five) minutes as needed for chest pain.  25 tablet 3   PREVIDENT 5000 ENAMEL PROTECT 1.1-5 % GEL Place onto teeth as directed.     pyridOXINE (VITAMIN B-6) 100 MG tablet Take 100 mg by mouth daily.     rosuvastatin (CRESTOR) 10 MG tablet Take 10 mg by mouth daily.     thiamine 100 MG tablet 1 tablet     valACYclovir (VALTREX) 1000 MG tablet Take 1,000 mg by mouth 3 (three) times daily.     Zinc 50 MG TABS Take by mouth.     Zoster Vaccine Adjuvanted [facility]) injection Inject into the muscle. 1 mL 0    Results for orders placed or performed during the hospital encounter of 08/24/21 (from the past 48 hour(s))  Comprehensive metabolic panel     Status: Abnormal   Collection Time: 08/24/21  5:27 PM  Result Value Ref Range   Sodium 137 135 - 145 mmol/L   Potassium 3.4 (L) 3.5 - 5.1 mmol/L   Chloride 101 98 - 111 mmol/L   CO2 25 22 - 32 mmol/L   Glucose, Bld 99 70 - 99 mg/dL    Comment: Glucose reference range applies only to samples taken after fasting for at least 8 hours.   BUN 11 6 - 20 mg/dL   Creatinine, Ser 0.88 0.61 - 1.24 mg/dL   Calcium 9.2 8.9 - 10.3 mg/dL   Total Protein 7.3 6.5 - 8.1 g/dL   Albumin 4.6 3.5 - 5.0 g/dL   AST 11 (L) 15 - 41 U/L   ALT 26 0 - 44 U/L   Alkaline Phosphatase 62 38 - 126 U/L   Total Bilirubin 1.3 (H) 0.3 - 1.2 mg/dL   GFR, Estimated >60 >60 mL/min    Comment: (NOTE) Calculated using the CKD-EPI Creatinine Equation (2021)    Anion gap 11 5 - 15    Comment: Performed at [facility], [address], [place], [place] [address]  CBC with Differential     Status: Abnormal   Collection Time: 08/24/21  5:27 PM  Result Value Ref Range   WBC 14.3 (H) 4.0 - 10.5 K/uL   RBC 4.85 4.22 - 5.81 MIL/uL   Hemoglobin 15.4 13.0 - 17.0 g/dL   HCT 42.8 39.0 - 52.0 %   MCV 88.2 80.0 - 100.0 fL   MCH 31.8 26.0 - 34.0 pg   MCHC 36.0 30.0 - 36.0 g/dL   RDW 12.5 11.5 - 15.5 %   Platelets 256 150 - 400 K/uL   nRBC  0.0 0.0 - 0.2 %   Neutrophils Relative % 70 %   Neutro Abs 9.9 (H)  1.7 - 7.7 K/uL   Lymphocytes Relative 17 %   Lymphs Abs 2.4 0.7 - 4.0 K/uL   Monocytes Relative 12 %   Monocytes Absolute 1.8 (H) 0.1 - 1.0 K/uL   Eosinophils Relative 1 %   Eosinophils Absolute 0.2 0.0 - 0.5 K/uL   Basophils Relative 0 %   Basophils Absolute 0.1 0.0 - 0.1 K/uL   Immature Granulocytes 0 %   Abs Immature Granulocytes 0.06 0.00 - 0.07 K/uL    Comment: Performed at [facility], [address], [place], [place] [address]  Resp Panel by RT-PCR (Flu A&B, Covid) Nasopharyngeal Swab     Status: None   Collection Time: 08/24/21  5:27 PM   Specimen: Nasopharyngeal Swab; Nasopharyngeal(NP) swabs in vial transport medium  Result Value Ref Range   SARS Coronavirus 2 by RT PCR NEGATIVE NEGATIVE    Comment: (NOTE) SARS-CoV-2 target nucleic acids are NOT DETECTED.  The SARS-CoV-2 RNA is generally detectable in upper respiratory specimens during the acute phase of infection. The lowest concentration of SARS-CoV-2 viral copies this assay can detect is 138 copies/mL. A negative result does not preclude SARS-Cov-2 infection and should not be used as the sole basis for treatment or other patient management decisions. A negative result may occur with  improper specimen collection/handling, submission of specimen other than nasopharyngeal swab, presence of viral mutation(s) within the areas targeted by this assay, and inadequate number of viral copies(<138 copies/mL). A negative result must be combined with clinical observations, patient history, and epidemiological information. The expected result is Negative.  Fact Sheet for Patients:  EntrepreneurPulse.com.au  Fact Sheet for Healthcare Providers:  IncredibleEmployment.be  This test is no t yet approved or cleared by the [place] FDA and  has been authorized for detection and/or diagnosis of SARS-CoV-2 by FDA under an Emergency Use Authorization (EUA). This EUA will  remain  in effect (meaning this test can be used) for the duration of the COVID-19 declaration under Section 564(b)(1) of the Act, 21 U.S.C.section 360bbb-3(b)(1), unless the authorization is terminated  or revoked sooner.       Influenza A by PCR NEGATIVE NEGATIVE   Influenza B by PCR NEGATIVE NEGATIVE    Comment: (NOTE) The Xpert Xpress SARS-CoV-2/FLU/RSV plus assay is intended as an aid in the diagnosis of influenza from Nasopharyngeal swab specimens and should not be used as a sole basis for treatment. Nasal washings and aspirates are unacceptable for Xpert Xpress SARS-CoV-2/FLU/RSV testing.  Fact Sheet for Patients: EntrepreneurPulse.com.au  Fact Sheet for Healthcare Providers: IncredibleEmployment.be  This test is not yet approved or cleared by the [place] FDA and has been authorized for detection and/or diagnosis of SARS-CoV-2 by FDA under an Emergency Use Authorization (EUA). This EUA will remain in effect (meaning this test can be used) for the duration of the COVID-19 declaration under Section 564(b)(1) of the Act, 21 U.S.C. section 360bbb-3(b)(1), unless the authorization is terminated or revoked.  Performed at [facility], [address], [place], [place] [address]    CT ABDOMEN PELVIS W CONTRAST  Result Date: 08/24/2021 CLINICAL DATA:  Right lower quadrant pain for 3 days, intermittent fever EXAM: CT ABDOMEN AND PELVIS WITH CONTRAST TECHNIQUE: Multidetector CT imaging of the abdomen and pelvis was performed using the standard protocol following bolus administration of intravenous contrast. CONTRAST:  135mL OMNIPAQUE IOHEXOL 300 MG/ML  SOLN COMPARISON:  None.  FINDINGS: Lower chest: No acute abnormality. Hepatobiliary: Scattered hepatic hypodensities compatible with small hepatic cysts. No other significant focal hepatic abnormality. No biliary dilatation or obstruction. Hepatic and portal veins are patent.  Remote cholecystectomy. Pancreas: Unremarkable. No pancreatic ductal dilatation or surrounding inflammatory changes. Spleen: Normal in size without focal abnormality. Adrenals/Urinary Tract: Adrenal glands are unremarkable. Kidneys are normal, without renal calculi, focal lesion, or hydronephrosis. Bladder is unremarkable. Stomach/Bowel: Negative for bowel obstruction, significant dilatation, ileus, or free air. Appendix is dilated with mucosal enhancement and surrounding inflammation, compatible with acute appendicitis. No evidence of rupture. Appendix measures 19 mm in diameter, image 66/2. Negative for abscess. No free fluid or ascites. No hemorrhage or hematoma. Vascular/Lymphatic: Infrarenal and iliac atherosclerosis noted. Negative for aneurysm. Mesenteric or renal vascular appear patent. No veno-occlusive process. No adenopathy. Reproductive: No significant finding by CT. Other: No abdominal wall hernia or abnormality. No abdominopelvic ascites. Musculoskeletal: Degenerative changes noted of the spine. IMPRESSION: Acute nonruptured appendicitis. Aortic Atherosclerosis (ICD10-I70.0). These results will be called to the ordering clinician or representative by the Radiologist Assistant, and communication documented in the PACS or [employer]. Electronically Signed   By: Jerilynn Mages.  Shick M.D.   On: 08/24/2021 16:47    Review of Systems  Constitutional: Negative.  Negative for chills and fever.  HENT: Negative.  Negative for ear discharge, hearing loss and sore throat.   Eyes: Negative.  Negative for discharge.  Respiratory: Negative.  Negative for cough and shortness of breath.   Cardiovascular: Negative.  Negative for chest pain and leg swelling.  Gastrointestinal:  Positive for abdominal pain. Negative for constipation, diarrhea, nausea and vomiting.  Endocrine: Negative.   Musculoskeletal:  Negative for myalgias and neck pain.  Skin:  Negative for rash.  Allergic/Immunologic: Negative for  environmental allergies.  Neurological: Negative.  Negative for dizziness and seizures.  Hematological:  Does not bruise/bleed easily.  Psychiatric/Behavioral:  Negative for suicidal ideas.   All other systems reviewed and are negative.  Blood pressure 118/79, pulse 80, temperature 98.6 F (37 C), resp. rate 18, height 6' (1.829 m), weight 99.8 kg, SpO2 97 %. Physical Exam Constitutional:      Appearance: He is well-developed.     Comments: Conversant No acute distress  HENT:     Head: Normocephalic and atraumatic.  Eyes:     General: Lids are normal. No scleral icterus.    Pupils: Pupils are equal, round, and reactive to light.     Comments: Pupils are equal round and reactive No lid lag Moist conjunctiva  Neck:     Thyroid: No thyromegaly.     Trachea: No tracheal tenderness.     Comments: No cervical lymphadenopathy Cardiovascular:     Rate and Rhythm: Normal rate and regular rhythm.     Heart sounds: No murmur heard. Pulmonary:     Effort: Pulmonary effort is normal.     Breath sounds: Normal breath sounds. No wheezing or rales.  Abdominal:     Tenderness: There is abdominal tenderness (RLQ). There is no guarding or rebound.     Hernia: No hernia is present.  Musculoskeletal:     Cervical back: Normal range of motion and neck supple.  Skin:    General: Skin is warm.     Findings: No rash.     Nails: There is no clubbing.     Comments: Normal skin turgor  Neurological:     Mental Status: He is alert and oriented to person, place, and time.  Comments: Normal gait and station  Psychiatric:        Mood and Affect: Mood normal.        Thought Content: Thought content normal.        Judgment: Judgment normal.     Comments: Appropriate affect     Assessment/Plan 55 year old male with acute appendicitis. 1.  We will proceed to the operating for urgent laparoscopic appendectomy. 2. I discussed with the patient the risks benefits of the procedure to include but not  limited to: Infection, bleeding, damage to surrounding structures, possible ileus, possible postoperative infection. Patient voiced understanding and wishes to proceed.   Ralene Ok, MD 08/24/2021, 8:10 PM

## 2021-08-24 NOTE — Transfer of Care (Signed)
Immediate Anesthesia Transfer of Care Note  Patient: Christopher Collins  Procedure(s) Performed: APPENDECTOMY LAPAROSCOPIC (Abdomen)  Patient Location: PACU  Anesthesia Type:General  Level of Consciousness: awake, alert , oriented and patient cooperative  Airway & Oxygen Therapy: Patient Spontanous Breathing  Post-op Assessment: Report given to RN and Post -op Vital signs reviewed and stable  Post vital signs: Reviewed and stable  Last Vitals:  Vitals Value Taken Time  BP 127/80 08/24/21 2126  Temp    Pulse 79 08/24/21 2130  Resp 14 08/24/21 2130  SpO2 100 % 08/24/21 2130  Vitals shown include unvalidated device data.  Last Pain:  Vitals:   08/24/21 1918  PainSc: 5          Complications: No notable events documented.

## 2021-08-24 NOTE — ED Notes (Signed)
ED Provider at bedside. 

## 2021-08-24 NOTE — ED Provider Notes (Signed)
[facility] EMERGENCY DEPT Provider Note   CSN: [id number] Arrival date & time: 08/24/21  1710     History Chief Complaint  Patient presents with   Appendicitis     Christopher Collins is a 55 y.o. male.  The history is provided by the patient and medical records. No language interpreter was used.  Abdominal Pain Pain location:  RLQ Pain quality: aching   Pain radiates to:  Periumbilical region Pain severity:  Mild Onset quality:  Gradual Duration:  6 days Timing:  Constant Progression:  Unchanged Chronicity:  Chronic Worsened by:  Nothing Ineffective treatments:  None tried Associated symptoms: chills, fatigue and fever   Associated symptoms: no chest pain, no constipation, no cough, no diarrhea, no dysuria, no nausea, no shortness of breath and no vomiting       Past Medical History:  Diagnosis Date   Arthritis    hands   Bipolar disorder ([place])    states mild   Degenerative joint disease of hand 03/2013   right   Dental crown present    also dental cap - lower   Depression    Hypersomnia with sleep apnea    Hypertension    under control with meds., has been on med. x 2 yr.   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   Laceration of left thumb without foreign body without damage to nail 08/30/2018   Laryngopharyngeal reflux (LPR) 05/14/2014   Pain, joint, knee, left 02/27/2014   OSA on CPAP 08/28/2013   Hypersomnia with sleep apnea, unspecified 08/28/2013   Hypersomnia with sleep apnea    Anxiety and depression 07/22/2013    Gastrointestinal food sensitivity 07/22/2013   Rotator cuff impingement syndrome 07/22/2013   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   Vitamin D deficiency 07/22/2013   Sensorineural hearing loss (SNHL) of both ears 05/10/2013   Hypersomnia, persistent 04/17/2013   Sleep apnea with use of continuous positive airway pressure (CPAP) 02/06/2013   Pain in joint, ankle and foot 11/13/2012   Abnormality of gait 11/13/2012    Past Surgical History:  Procedure Laterality Date   CHOLECYSTECTOMY  2000s   FINGER SURGERY Right 03/2013   right middle finger-cyst removed   TONSILLECTOMY AND ADENOIDECTOMY     as a child       Family History  Problem Relation Age of Onset   Lung cancer Father    AAA (abdominal aortic aneurysm) Father    Heart attack Paternal Grandfather 84   AAA (abdominal aortic aneurysm) Paternal Grandfather        Died from this age 54    Social History   Tobacco Use   Smoking status: Never   Smokeless tobacco: Never  Substance Use Topics   Alcohol use: Yes    Comment: occasionally   Drug use: No    Home Medications Prior to Admission medications   Medication Sig Start Date End Date Taking? Authorizing Provider  ALPRAZolam (XANAX) 0.25 MG tablet Take 0.125 mg by mouth 3 (three) times daily. 02/26/20   [provider]  amLODipine ([place])  5 MG tablet Take 10 mg by mouth daily.     [provider]  aspirin EC 81 MG tablet Take 1 tablet (81 mg total) by mouth daily. 02/13/20   Minus Breeding, MD  chlorhexidine (PERIDEX) 0.12 % solution 5 mLs 2 (two) times daily. 03/11/21   [provider]  clotrimazole-betamethasone (LOTRISONE) cream Apply 1 application topically 2 (two) times daily. 03/26/21   Hilts, Legrand Como, MD  COVID-19 mRNA vaccine, Moderna, 100 MCG/0.5ML injection Inject into the muscle. 01/14/21   Carlyle Basques, MD  cyclobenzaprine (FLEXERIL) 10 MG tablet Take 10 mg by mouth as needed.  07/09/14   [provider]  dexamethasone (DECADRON)  4 MG/ML injection Apply as needed for iontophoresis 07/29/20   Hilts, Michael, MD  econazole nitrate 1 % cream 1 APPLICATION TO AFFECTED AREA ONCE A DAY EXTERNALLY 30 DAYS 10/19/16   [provider]  Ginkgo Biloba 40 MG TABS Take 1 tablet by mouth daily.    [provider]  ketoconazole (NIZORAL) 2 % cream 1 APPLICATION TO AFFECTED AREA AS NEEDED EXTERNALLY 30 DAYS 10/17/16   [provider]  lamoTRIgine (LAMICTAL) 150 MG tablet Take 150 mg by mouth daily. 03/20/21   [provider]  lisinopril (PRINIVIL,ZESTRIL) 10 MG tablet Take 10 mg by mouth daily.    [provider]  mirtazapine (REMERON) 15 MG tablet Take 7.5-15 mg by mouth at bedtime. 03/13/21   [provider]  mirtazapine (REMERON) 45 MG tablet Take 45 mg by mouth at bedtime.    [provider]  mometasone (NASONEX) 50 MCG/ACT nasal spray Place 2 sprays into the nose daily.  04/07/14   [provider]  montelukast (SINGULAIR) 10 MG tablet Take 1 tablet (10 mg total) by mouth daily. 08/10/15   Kozlow, Donnamarie Poag, MD  nitroGLYCERIN (NITROSTAT) 0.4 MG SL tablet Place 1 tablet (0.4 mg total) under the tongue every 5 (five) minutes as needed for chest pain. 02/13/20 05/11/21  Minus Breeding, MD  PREVIDENT 5000 ENAMEL PROTECT 1.1-5 % GEL Place onto teeth as directed. 11/12/20   [provider]  pyridOXINE (VITAMIN B-6) 100 MG tablet Take 100 mg by mouth daily.    [provider]  rosuvastatin (CRESTOR) 10 MG tablet Take 10 mg by mouth daily.    [provider]  thiamine 100 MG tablet 1 tablet    [provider]  valACYclovir (VALTREX) 1000 MG tablet Take 1,000 mg by mouth 3 (three) times daily. 07/23/20   [provider]  Zinc 50 MG TABS Take by mouth.    [provider]  Zoster Vaccine Adjuvanted [facility]) injection Inject into the muscle. 02/22/21   Margie Ege, RPH    Allergies    Nuvigil [armodafinil], Lac bovis, Smallpox vaccine,  Doxycycline, Gluten meal, and Milk-related compounds  Review of Systems   Review of Systems  Constitutional:  Positive for chills, fatigue and fever.  HENT:  Negative for congestion.   Respiratory:  Negative for cough, chest tightness and shortness of breath.   Cardiovascular:  Negative for chest pain.  Gastrointestinal:  Positive for abdominal pain. Negative for constipation, diarrhea, nausea and vomiting.  Genitourinary:  Negative for dysuria, flank pain and frequency.  Musculoskeletal:  Negative for back pain and neck pain.  Neurological:  Negative for headaches.  Psychiatric/Behavioral:  Negative for agitation.   All other systems reviewed and are negative.  Physical Exam Updated Vital Signs There were no vitals taken for this visit.  Physical Exam Vitals and nursing  note reviewed.  Constitutional:      General: He is not in acute distress.    Appearance: He is well-developed. He is not ill-appearing, toxic-appearing or diaphoretic.  HENT:     Head: Normocephalic and atraumatic.     Nose: No congestion or rhinorrhea.     Mouth/Throat:     Mouth: Mucous membranes are moist.     Pharynx: No oropharyngeal exudate or posterior oropharyngeal erythema.  Eyes:     Extraocular Movements: Extraocular movements intact.     Conjunctiva/sclera: Conjunctivae normal.     Pupils: Pupils are equal, round, and reactive to light.  Cardiovascular:     Rate and Rhythm: Normal rate and regular rhythm.     Heart sounds: No murmur heard. Pulmonary:     Effort: Pulmonary effort is normal. No respiratory distress.     Breath sounds: Normal breath sounds. No wheezing, rhonchi or rales.  Chest:     Chest wall: No tenderness.  Abdominal:     General: Abdomen is flat.     Palpations: Abdomen is soft.     Tenderness: There is abdominal tenderness. There is no right CVA tenderness, left CVA tenderness, guarding or rebound.  Musculoskeletal:        General: No swelling or tenderness.     Cervical  back: Neck supple.  Skin:    General: Skin is warm and dry.     Capillary Refill: Capillary refill takes less than 2 seconds.     Findings: No erythema.  Neurological:     General: No focal deficit present.     Mental Status: He is alert.  Psychiatric:        Mood and Affect: Mood normal.    ED Results / Procedures / Treatments   Labs (all labs ordered are listed, but only abnormal results are displayed) Labs Reviewed  COMPREHENSIVE METABOLIC PANEL - Abnormal; Notable for the following components:      Result Value   Potassium 3.4 (*)    AST 11 (*)    Total Bilirubin 1.3 (*)    All other components within normal limits  CBC WITH DIFFERENTIAL/PLATELET - Abnormal; Notable for the following components:   WBC 14.3 (*)    Neutro Abs 9.9 (*)    Monocytes Absolute 1.8 (*)    All other components within normal limits  RESP PANEL BY RT-PCR (FLU A&B, COVID) [id number]    EKG None  Radiology CT ABDOMEN PELVIS W CONTRAST  Result Date: 08/24/2021 CLINICAL DATA:  Right lower quadrant pain for 3 days, intermittent fever EXAM: CT ABDOMEN AND PELVIS WITH CONTRAST TECHNIQUE: Multidetector CT imaging of the abdomen and pelvis was performed using the standard protocol following bolus administration of intravenous contrast. CONTRAST:  190mL OMNIPAQUE IOHEXOL 300 MG/ML  SOLN COMPARISON:  None. FINDINGS: Lower chest: No acute abnormality. Hepatobiliary: Scattered hepatic hypodensities compatible with small hepatic cysts. No other significant focal hepatic abnormality. No biliary dilatation or obstruction. Hepatic and portal veins are patent. Remote cholecystectomy. Pancreas: Unremarkable. No pancreatic ductal dilatation or surrounding inflammatory changes. Spleen: Normal in size without focal abnormality. Adrenals/Urinary Tract: Adrenal glands are unremarkable. Kidneys are normal, without renal calculi, focal lesion, or hydronephrosis. Bladder is unremarkable. Stomach/Bowel: Negative for bowel obstruction,  significant dilatation, ileus, or free air. Appendix is dilated with mucosal enhancement and surrounding inflammation, compatible with acute appendicitis. No evidence of rupture. Appendix measures 19 mm in diameter, image 66/2. Negative for abscess. No free fluid or ascites. No hemorrhage or hematoma. Vascular/Lymphatic: Infrarenal  and iliac atherosclerosis noted. Negative for aneurysm. Mesenteric or renal vascular appear patent. No veno-occlusive process. No adenopathy. Reproductive: No significant finding by CT. Other: No abdominal wall hernia or abnormality. No abdominopelvic ascites. Musculoskeletal: Degenerative changes noted of the spine. IMPRESSION: Acute nonruptured appendicitis. Aortic Atherosclerosis (ICD10-I70.0). These results will be called to the ordering clinician or representative by the Radiologist Assistant, and communication documented in the PACS or [employer]. Electronically Signed   By: Jerilynn Mages.  Shick M.D.   On: 08/24/2021 16:47    Procedures Procedures   Medications Ordered in ED Medications  cefTRIAXone (ROCEPHIN) 2 g in sodium chloride 0.9 % 100 mL IVPB (has no administration in time range)    And  metroNIDAZOLE (FLAGYL) IVPB 500 mg (has no administration in time range)  0.9 %  sodium chloride infusion ( Intravenous New Bag/Given 08/24/21 1828)    ED Course  I have reviewed the triage vital signs and the nursing notes.  Pertinent labs & imaging results that were available during my care of the patient were reviewed by me and considered in my medical decision making (see chart for details).    MDM Rules/Calculators/A&P                           Christopher Collins is a 55 y.o. male with a past medical history significant for hypertension, previous cholecystectomy, dyslipidemia, and bipolar disorder who presents with right lower quadrant abdominal pain and CT revealing appendicitis.  According to patient, for the last week he has had some malaise and fatigue and her  last 2 days has developed discomfort in his abdomen and migrated to the right lower quadrant.  He reports the pain is mild to moderate at times but denies other nausea vomiting, constipation, or diarrhea.  Denies urinary changes and denies trauma.  Reports that he saw his PCP yesterday and they agreed to get a CT scan which she had done today.  The CT scan shows acute appendicitis and patient was sent to the emergency department for evaluation.  On my exam, he does have some right lower quadrant abdominal tenderness but otherwise had normal bowel sounds.  Lungs were clear and chest was nontender.  Patient otherwise resting comfortably with reassuring vital signs.  Is a CT already shows appendicitis, we will get screening and preadmission/procedure labs.  We will get a COVID test and CBC and CMP.  I called general surgery and spoke to Dr. Rosendo Gros who agrees that the patient will need surgery.  He will have the patient transferred directly to the preoperative area at [facility] for presumptive surgery this evening.  Patient will remain n.p.o. and he requested antibiotics.  Had discussion with patient and we agreed to do Rocephin and Flagyl which was ordered.  Patient will remain n.p.o. and will be transferred to the care of Dr. Rosendo Gros for operative management of acute appendicitis.  Final Clinical Impression(s) / ED Diagnoses Final diagnoses:  Acute appendicitis, unspecified acute appendicitis type    Clinical Impression: 1. Acute appendicitis, unspecified acute appendicitis type     Disposition: Admit  This note was prepared with assistance of Dragon voice recognition software. Occasional wrong-word or sound-a-like substitutions may have occurred due to the inherent limitations of voice recognition software.     Kyro Joswick, Gwenyth Allegra, MD 08/25/21 [phone]

## 2021-08-24 NOTE — ED Triage Notes (Signed)
Patient reports to the ER for acute Appendicitis

## 2021-08-24 NOTE — Op Note (Signed)
08/24/2021  9:15 PM  PATIENT:  Christopher Collins  55 y.o. male  PRE-OPERATIVE DIAGNOSIS:  Appendicitis  POST-OPERATIVE DIAGNOSIS:  appendicitis, non perforated  PROCEDURE:  Procedure(s): APPENDECTOMY LAPAROSCOPIC (N/A)  SURGEON:  Surgeon(s) and Role:    Ralene Ok, MD - Primary  ANESTHESIA:   local and general  EBL:  25 mL   BLOOD ADMINISTERED:none  DRAINS: none   LOCAL MEDICATIONS USED:  BUPIVICAINE   SPECIMEN:  Source of Specimen:  appendix  DISPOSITION OF SPECIMEN:  PATHOLOGY  COUNTS:  YES  TOURNIQUET:  * No tourniquets in log *  DICTATION: .Dragon Dictation  Complications: none  Counts: reported as correct x 2  Findings:  The patient had a acutely inflamed, non perforated appendix  Specimen: Appendix  Indications for procedure:  The patient is a 55 year old male with a history of periumbilical pain localized in the right lower quadrant patient had a CT scan which revealed signs consistent with acute appendicitis the patient back in for laparoscopic appendectomy.  Details of the procedure:The patient was taken back to the operating room. The patient was placed in supine position with bilateral SCDs in place.  The patient was prepped and draped in the usual sterile fashion.  After appropriate anitbiotics were confirmed, a time-out was confirmed and all facts were verified.    A pneumoperitoneum of 14 mmHg was obtained via a Veress needle technique in the left lower quadrant quadrant.  A 5 mm trocar and 5 mm camera then placed intra-abdominally there is no injury to any intra-abdominal organs a 10 mm infraumbilical port was placed and direct visualization as was a 5 mm port in the suprapubic area.   The appendix was identified and seen to be non-perforated.  The appendix was cleaned down to the appendiceal base. The mesoappendix was then incised and the appendiceal artery was cauterized.  The the appendiceal base was clean.  A gold hemoclip was placed  proximallyx2 and one distally and the appendix was transected between these 2. A retrieval bag was then placed into the abdomen and the specimen placed in the bag. The appendiceal stump was cauterized.  There was some bleeding from the appediceal artery.  This was clipped with one gold clip. We evacuate the fluid from the pelvis until the effluent was clear.  The appendix and retrieval  bag was then retrieved via the supraumbilical port. #1 Vicryl was used to reapproximate the fascia at the umbilical port site x2. The skin was reapproximated all port sites 3-0 Monocryl subcuticular fashion. The skin was dressed with Dermabond.  The patient had the foley removed. The patient was awakened from general anesthesia was taken to recovery room in stable condition.     PLAN OF CARE: Discharge to home after PACU  PATIENT DISPOSITION:  PACU - hemodynamically stable.   Delay start of Pharmacological VTE agent (>24hrs) due to surgical blood loss or risk of bleeding: not applicable

## 2021-08-24 NOTE — Anesthesia Preprocedure Evaluation (Signed)
Anesthesia Evaluation  Patient identified by MRN, date of birth, ID band Patient awake    Reviewed: Allergy & Precautions, NPO status , Patient's Chart, lab work & pertinent test results  Airway Mallampati: III  TM Distance: >3 FB Neck ROM: Full    Dental  (+) Dental Advisory Given   Pulmonary sleep apnea and Continuous Positive Airway Pressure Ventilation ,    breath sounds clear to auscultation       Cardiovascular hypertension, Pt. on medications  Rhythm:Regular Rate:Normal     Neuro/Psych negative neurological ROS     GI/Hepatic Neg liver ROS, Acute appendicitis    Endo/Other  negative endocrine ROS  Renal/GU negative Renal ROS     Musculoskeletal  (+) Arthritis ,   Abdominal   Peds  Hematology negative hematology ROS (+)   Anesthesia Other Findings   Reproductive/Obstetrics                             Anesthesia Physical Anesthesia Plan  ASA: 2  Anesthesia Plan: General   Post-op Pain Management: Toradol IV (intra-op) and Ofirmev IV (intra-op)   Induction: Intravenous and Rapid sequence  PONV Risk Score and Plan: 2 and Dexamethasone, Ondansetron and Treatment may vary due to age or medical condition  Airway Management Planned: Oral ETT  Additional Equipment: None  Intra-op Plan:   Post-operative Plan: Extubation in OR  Informed Consent: I have reviewed the patients History and Physical, chart, labs and discussed the procedure including the risks, benefits and alternatives for the proposed anesthesia with the patient or authorized representative who has indicated his/her understanding and acceptance.     Dental advisory given  Plan Discussed with: CRNA  Anesthesia Plan Comments:         Anesthesia Quick Evaluation

## 2021-08-24 NOTE — Anesthesia Procedure Notes (Signed)
Procedure Name: Intubation Date/Time: 08/24/2021 8:24 PM Performed by: Oletta Lamas, CRNA Pre-anesthesia Checklist: Patient identified, Emergency Drugs available, Suction available and Patient being monitored Patient Re-evaluated:Patient Re-evaluated prior to induction Oxygen Delivery Method: Circle System Utilized Preoxygenation: Pre-oxygenation with 100% oxygen Induction Type: IV induction and Rapid sequence Laryngoscope Size: Miller and 3 Grade View: Grade I Tube type: Oral Number of attempts: 1 Airway Equipment and Method: Stylet Placement Confirmation: ETT inserted through vocal cords under direct vision, positive ETCO2 and breath sounds checked- equal and bilateral Secured at: 23 cm Tube secured with: Tape Dental Injury: Teeth and Oropharynx as per pre-operative assessment

## 2021-08-25 ENCOUNTER — Encounter (HOSPITAL_COMMUNITY): Payer: Self-pay | Admitting: General Surgery

## 2021-08-25 NOTE — Anesthesia Postprocedure Evaluation (Signed)
Anesthesia Post Note  Patient: Christopher Collins  Procedure(s) Performed: APPENDECTOMY LAPAROSCOPIC (Abdomen)     Patient location during evaluation: PACU Anesthesia Type: General Level of consciousness: awake and alert Pain management: pain level controlled Vital Signs Assessment: post-procedure vital signs reviewed and stable Respiratory status: spontaneous breathing, nonlabored ventilation, respiratory function stable and patient connected to nasal cannula oxygen Cardiovascular status: blood pressure returned to baseline and stable Postop Assessment: no apparent nausea or vomiting Anesthetic complications: no   No notable events documented.  Last Vitals:  Vitals:   08/24/21 2310 08/24/21 2325  BP: 140/80 135/86  Pulse: 80 79  Resp: 12 13  Temp:  36.7 C  SpO2: 98% 96%    Last Pain:  Vitals:   08/24/21 2310  PainSc: 4                  Tiajuana Amass

## 2021-08-26 LAB — SURGICAL PATHOLOGY

## 2021-08-27 ENCOUNTER — Encounter (HOSPITAL_BASED_OUTPATIENT_CLINIC_OR_DEPARTMENT_OTHER): Payer: Self-pay

## 2021-08-27 ENCOUNTER — Emergency Department (HOSPITAL_BASED_OUTPATIENT_CLINIC_OR_DEPARTMENT_OTHER): Payer: BC Managed Care – PPO

## 2021-08-27 ENCOUNTER — Other Ambulatory Visit: Payer: Self-pay

## 2021-08-27 ENCOUNTER — Inpatient Hospital Stay (HOSPITAL_BASED_OUTPATIENT_CLINIC_OR_DEPARTMENT_OTHER)
Admission: EM | Admit: 2021-08-27 | Discharge: 2021-08-31 | DRG: 919 | Disposition: A | Discharge status: Home / Self Care | Payer: BC Managed Care – PPO | Attending: General Surgery | Admitting: General Surgery

## 2021-08-27 DIAGNOSIS — K661 Hemoperitoneum: Secondary | ICD-10-CM | POA: Diagnosis present

## 2021-08-27 DIAGNOSIS — J069 Acute upper respiratory infection, unspecified: Secondary | ICD-10-CM | POA: Diagnosis present

## 2021-08-27 DIAGNOSIS — Z881 Allergy status to other antibiotic agents status: Secondary | ICD-10-CM

## 2021-08-27 DIAGNOSIS — K5900 Constipation, unspecified: Secondary | ICD-10-CM | POA: Diagnosis present

## 2021-08-27 DIAGNOSIS — D62 Acute posthemorrhagic anemia: Secondary | ICD-10-CM

## 2021-08-27 DIAGNOSIS — Z8249 Family history of ischemic heart disease and other diseases of the circulatory system: Secondary | ICD-10-CM

## 2021-08-27 DIAGNOSIS — Z79899 Other long term (current) drug therapy: Secondary | ICD-10-CM

## 2021-08-27 DIAGNOSIS — F319 Bipolar disorder, unspecified: Secondary | ICD-10-CM | POA: Diagnosis present

## 2021-08-27 DIAGNOSIS — I10 Essential (primary) hypertension: Secondary | ICD-10-CM | POA: Diagnosis present

## 2021-08-27 DIAGNOSIS — Z888 Allergy status to other drugs, medicaments and biological substances status: Secondary | ICD-10-CM

## 2021-08-27 DIAGNOSIS — Z20822 Contact with and (suspected) exposure to covid-19: Secondary | ICD-10-CM | POA: Diagnosis present

## 2021-08-27 DIAGNOSIS — K91841 Postprocedural hemorrhage and hematoma of a digestive system organ or structure following other procedure: Principal | ICD-10-CM | POA: Diagnosis present

## 2021-08-27 DIAGNOSIS — G4730 Sleep apnea, unspecified: Secondary | ICD-10-CM | POA: Diagnosis present

## 2021-08-27 DIAGNOSIS — E785 Hyperlipidemia, unspecified: Secondary | ICD-10-CM | POA: Diagnosis present

## 2021-08-27 DIAGNOSIS — R059 Cough, unspecified: Secondary | ICD-10-CM

## 2021-08-27 DIAGNOSIS — Z7982 Long term (current) use of aspirin: Secondary | ICD-10-CM

## 2021-08-27 DIAGNOSIS — N179 Acute kidney failure, unspecified: Secondary | ICD-10-CM

## 2021-08-27 DIAGNOSIS — Z9049 Acquired absence of other specified parts of digestive tract: Secondary | ICD-10-CM

## 2021-08-27 LAB — CBC WITH DIFFERENTIAL/PLATELET
Abs Immature Granulocytes: 0.03 10*3/uL (ref 0.00–0.07)
Basophils Absolute: 0.1 10*3/uL (ref 0.0–0.1)
Basophils Relative: 1 %
Eosinophils Absolute: 0.1 10*3/uL (ref 0.0–0.5)
Eosinophils Relative: 1 %
HCT: 24.8 % — ABNORMAL LOW (ref 39.0–52.0)
Hemoglobin: 8.6 g/dL — ABNORMAL LOW (ref 13.0–17.0)
Immature Granulocytes: 0 %
Lymphocytes Relative: 16 %
Lymphs Abs: 1.6 10*3/uL (ref 0.7–4.0)
MCH: 30.9 pg (ref 26.0–34.0)
MCHC: 34.7 g/dL (ref 30.0–36.0)
MCV: 89.2 fL (ref 80.0–100.0)
Monocytes Absolute: 1.2 10*3/uL — ABNORMAL HIGH (ref 0.1–1.0)
Monocytes Relative: 12 %
Neutro Abs: 7.1 10*3/uL (ref 1.7–7.7)
Neutrophils Relative %: 70 %
Platelets: 288 10*3/uL (ref 150–400)
RBC: 2.78 MIL/uL — ABNORMAL LOW (ref 4.22–5.81)
RDW: 12.3 % (ref 11.5–15.5)
WBC: 10.1 10*3/uL (ref 4.0–10.5)
nRBC: 0 % (ref 0.0–0.2)

## 2021-08-27 LAB — COMPREHENSIVE METABOLIC PANEL
ALT: 62 U/L — ABNORMAL HIGH (ref 0–44)
AST: 43 U/L — ABNORMAL HIGH (ref 15–41)
Albumin: 3.9 g/dL (ref 3.5–5.0)
Alkaline Phosphatase: 58 U/L (ref 38–126)
Anion gap: 12 (ref 5–15)
BUN: 43 mg/dL — ABNORMAL HIGH (ref 6–20)
CO2: 23 mmol/L (ref 22–32)
Calcium: 8.7 mg/dL — ABNORMAL LOW (ref 8.9–10.3)
Chloride: 99 mmol/L (ref 98–111)
Creatinine, Ser: 2.27 mg/dL — ABNORMAL HIGH (ref 0.61–1.24)
GFR, Estimated: 33 mL/min — ABNORMAL LOW (ref >60)
Glucose, Bld: 143 mg/dL — ABNORMAL HIGH (ref 70–99)
Potassium: 3.6 mmol/L (ref 3.5–5.1)
Sodium: 134 mmol/L — ABNORMAL LOW (ref 135–145)
Total Bilirubin: 0.9 mg/dL (ref 0.3–1.2)
Total Protein: 6.7 g/dL (ref 6.5–8.1)

## 2021-08-27 LAB — RESP PANEL BY RT-PCR (FLU A&B, COVID) ARPGX2
Influenza A by PCR: NEGATIVE
Influenza B by PCR: NEGATIVE
SARS Coronavirus 2 by RT PCR: NEGATIVE

## 2021-08-27 LAB — URINALYSIS, ROUTINE W REFLEX MICROSCOPIC
Bilirubin Urine: NEGATIVE
Glucose, UA: NEGATIVE mg/dL
Hgb urine dipstick: NEGATIVE
Ketones, ur: NEGATIVE mg/dL
Leukocytes,Ua: NEGATIVE
Nitrite: NEGATIVE
Protein, ur: NEGATIVE mg/dL
Specific Gravity, Urine: 1.005 (ref 1.005–1.030)
pH: 5.5 (ref 5.0–8.0)

## 2021-08-27 MED ORDER — ACETAMINOPHEN 500 MG PO TABS
500.0000 mg | ORAL_TABLET | Freq: Once | ORAL | Status: AC
  Administered 2021-08-27: 500 mg via ORAL
  Filled 2021-08-27: qty 1

## 2021-08-27 MED ORDER — SODIUM CHLORIDE 0.9 % IV BOLUS
1000.0000 mL | Freq: Once | INTRAVENOUS | Status: AC
  Administered 2021-08-27: 1000 mL via INTRAVENOUS

## 2021-08-27 MED ORDER — ACETAMINOPHEN 500 MG PO TABS: 1000.0000 mg | ORAL_TABLET | Freq: Once | ORAL | Status: DC

## 2021-08-27 NOTE — ED Notes (Signed)
Patient transported to Radiology 

## 2021-08-27 NOTE — ED Triage Notes (Signed)
Pt reports recent appendicitis. Had appendectomy 3 days ago and has been running fever. Fever 102.5 at home. Directed to ER for CT abdomen to r/o postoperative abscess.

## 2021-08-27 NOTE — ED Notes (Signed)
Patient returned from Radiology. 

## 2021-08-27 NOTE — ED Notes (Signed)
[facility] notified for general surgery consult

## 2021-08-27 NOTE — ED Provider Notes (Signed)
[facility] Provider Note   CSN: [id number] Arrival date & time: 08/27/21  2050     History Chief Complaint  Patient presents with   Post-op Problem    Christopher Collins is a 55 y.o. male.  Pt presents to the ED today with a fever.  Pt said he was diagnosed with acute appendicitis on 12/13.  He went to the OR and was d/c later that night.  Pt said he developed chills today and checked his temp.  It was 102.5 at home.  He did not take anything for it.  He did call the on call surgeon who told him to come to the ED for a CT to r/o post-op abscess.  Pt said he's having mild abd pain, but it does not feel terrible.  No cough.  No sob.  No rashes.      Past Medical History:  Diagnosis Date   Arthritis    hands   Bipolar disorder ([place])    states mild   Degenerative joint disease of hand 03/2013   right   Dental crown present    also dental cap - lower   Depression    Hypersomnia with sleep apnea    Hypertension    under control with meds., has been on med. x 2 yr.   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   Laceration of left thumb without foreign body without damage to nail 08/30/2018   Laryngopharyngeal reflux (LPR) 05/14/2014   Pain, joint, knee, left 02/27/2014   OSA on CPAP 08/28/2013   Hypersomnia with sleep apnea, unspecified 08/28/2013   Hypersomnia with sleep apnea    Anxiety and depression 07/22/2013   Gastrointestinal food sensitivity 07/22/2013   Rotator cuff impingement syndrome  07/22/2013   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   Vitamin D deficiency 07/22/2013   Sensorineural hearing loss (SNHL) of both ears 05/10/2013   Hypersomnia, persistent 04/17/2013   Sleep apnea with use of continuous positive airway pressure (CPAP) 02/06/2013   Pain in joint, ankle and foot 11/13/2012   Abnormality of gait 11/13/2012    Past Surgical History:  Procedure Laterality Date   CHOLECYSTECTOMY  2000s   FINGER SURGERY Right 03/2013   right middle finger-cyst removed   LAPAROSCOPIC APPENDECTOMY N/A 08/24/2021   Procedure: APPENDECTOMY LAPAROSCOPIC;  Surgeon: Ralene Ok, MD;  Location: [facility];  Service: General;  Laterality: N/A;   TONSILLECTOMY AND ADENOIDECTOMY     as a child       Family History  Problem Relation Age of Onset   Lung cancer Father    AAA (abdominal aortic aneurysm) Father    Heart attack Paternal Grandfather 29   AAA (abdominal aortic aneurysm) Paternal Grandfather        Died from this age 30    Social History   Tobacco Use   Smoking status: Never    Passive exposure: Past   Smokeless tobacco: Never  Vaping Use   Vaping Use: Never used  Substance Use Topics   Alcohol use:  Not Currently    Comment: None for the last year   Drug use: No    Home Medications Prior to Admission medications   Medication Sig Start Date End Date Taking? Authorizing Provider  ALPRAZolam (XANAX) 0.25 MG tablet Take 0.125 mg by mouth 3 (three) times daily. 02/26/20   [provider]  amLODipine (NORVASC) 5 MG tablet Take 10 mg by mouth daily.     [provider]  aspirin EC 81 MG tablet Take 1 tablet (81 mg total) by mouth daily. 02/13/20   Minus Breeding, MD  chlorhexidine (PERIDEX) 0.12 % solution 5 mLs 2 (two) times daily. 03/11/21   [provider]  clotrimazole-betamethasone (LOTRISONE) cream Apply 1 application topically 2 (two) times daily. 03/26/21   Hilts, Legrand Como, MD  COVID-19 mRNA vaccine, Moderna, 100 MCG/0.5ML injection Inject into  the muscle. 01/14/21   Carlyle Basques, MD  cyclobenzaprine (FLEXERIL) 10 MG tablet Take 10 mg by mouth as needed.  07/09/14   [provider]  dexamethasone (DECADRON) 4 MG/ML injection Apply as needed for iontophoresis 07/29/20   Hilts, Michael, MD  econazole nitrate 1 % cream 1 APPLICATION TO AFFECTED AREA ONCE A DAY EXTERNALLY 30 DAYS 10/19/16   [provider]  Ginkgo Biloba 40 MG TABS Take 1 tablet by mouth daily.    [provider]  ketoconazole (NIZORAL) 2 % cream 1 APPLICATION TO AFFECTED AREA AS NEEDED EXTERNALLY 30 DAYS 10/17/16   [provider]  lamoTRIgine (LAMICTAL) 150 MG tablet Take 150 mg by mouth daily. 03/20/21   [provider]  lisinopril (PRINIVIL,ZESTRIL) 10 MG tablet Take 10 mg by mouth daily.    [provider]  mirtazapine (REMERON) 15 MG tablet Take 7.5-15 mg by mouth at bedtime. 03/13/21   [provider]  mirtazapine (REMERON) 45 MG tablet Take 45 mg by mouth at bedtime.    [provider]  mometasone (NASONEX) 50 MCG/ACT nasal spray Place 2 sprays into the nose daily.  04/07/14   [provider]  montelukast (SINGULAIR) 10 MG tablet Take 1 tablet (10 mg total) by mouth daily. 08/10/15   Kozlow, Donnamarie Poag, MD  nitroGLYCERIN (NITROSTAT) 0.4 MG SL tablet Place 1 tablet (0.4 mg total) under the tongue every 5 (five) minutes as needed for chest pain. 02/13/20 05/11/21  Minus Breeding, MD  ondansetron (ZOFRAN) 4 MG tablet Take 1 tablet (4 mg total) by mouth daily as needed for nausea or vomiting. 08/24/21 08/24/22  Ralene Ok, MD  PREVIDENT 5000 ENAMEL PROTECT 1.1-5 % GEL Place onto teeth as directed. 11/12/20   [provider]  pyridOXINE (VITAMIN B-6) 100 MG tablet Take 100 mg by mouth daily.    [provider]  rosuvastatin (CRESTOR) 10 MG tablet Take 10 mg by mouth daily.    [provider]  thiamine 100 MG tablet 1 tablet    [provider]  traMADol (ULTRAM) 50 MG  tablet Take 1 tablet (50 mg total) by mouth every 6 (six) hours as needed. 08/24/21 08/24/22  Ralene Ok, MD  valACYclovir (VALTREX) 1000 MG tablet Take 1,000 mg by mouth 3 (three) times daily. 07/23/20   [provider]  Zinc 50 MG TABS Take by mouth.    [provider]  Zoster Vaccine Adjuvanted [facility]) injection Inject into the muscle. 02/22/21   Margie Ege, RPH    Allergies    Nuvigil [armodafinil], Lac bovis, Smallpox vaccine, Doxycycline, Gluten meal, and Milk-related compounds  Review of Systems   Review  of Systems  Constitutional:  Positive for fever.  All other systems reviewed and are negative.  Physical Exam Updated Vital Signs BP 116/60 (BP Location: Right Arm)    Pulse 87    Temp 100.2 F (37.9 C) (Oral)    Resp 16    Ht 6' (1.829 m)    Wt 99.8 kg    SpO2 100%    BMI 29.84 kg/m   Physical Exam Vitals and nursing note reviewed.  Constitutional:      Appearance: Normal appearance.  HENT:     Head: Normocephalic and atraumatic.     Right Ear: External ear normal.     Left Ear: External ear normal.     Nose: Nose normal.     Mouth/Throat:     Mouth: Mucous membranes are moist.     Pharynx: Oropharynx is clear.  Eyes:     Extraocular Movements: Extraocular movements intact.     Conjunctiva/sclera: Conjunctivae normal.     Pupils: Pupils are equal, round, and reactive to light.  Cardiovascular:     Rate and Rhythm: Regular rhythm. Tachycardia present.     Pulses: Normal pulses.     Heart sounds: Normal heart sounds.  Pulmonary:     Effort: Pulmonary effort is normal.     Breath sounds: Normal breath sounds.  Abdominal:     General: Abdomen is flat. Bowel sounds are decreased.     Comments: Abdomen is mildly distended.  His surgical sites look good.  Musculoskeletal:        General: Normal range of motion.     Cervical back: Normal range of motion and neck supple.  Skin:    General: Skin is warm.     Capillary Refill: Capillary  refill takes less than 2 seconds.  Neurological:     General: No focal deficit present.     Mental Status: He is alert and oriented to person, place, and time.  Psychiatric:        Mood and Affect: Mood normal.        Behavior: Behavior normal.    ED Results / Procedures / Treatments   Labs (all labs ordered are listed, but only abnormal results are displayed) Labs Reviewed  CBC WITH DIFFERENTIAL/PLATELET - Abnormal; Notable for the following components:      Result Value   RBC 2.78 (*)    Hemoglobin 8.6 (*)    HCT 24.8 (*)    Monocytes Absolute 1.2 (*)    All other components within normal limits  COMPREHENSIVE METABOLIC PANEL - Abnormal; Notable for the following components:   Sodium 134 (*)    Glucose, Bld 143 (*)    BUN 43 (*)    Creatinine, Ser 2.27 (*)    Calcium 8.7 (*)    AST 43 (*)    ALT 62 (*)    GFR, Estimated 33 (*)    All other components within normal limits  RESP PANEL BY RT-PCR (FLU A&B, COVID) [id number]    EKG None  Radiology CT ABDOMEN PELVIS WO CONTRAST  Result Date: 08/27/2021 CLINICAL DATA:  Status post appendectomy.  Abdominal pain and fever. EXAM: CT ABDOMEN AND PELVIS WITHOUT CONTRAST TECHNIQUE: Multidetector CT imaging of the abdomen and pelvis was performed following the standard protocol without IV contrast. COMPARISON:  CT abdomen and pelvis 08/24/2021 FINDINGS: Lower chest: There are atelectatic changes in both lung bases. There is a trace right pleural effusion. Hepatobiliary: There are rounded hypodensities in the liver which are too  small to characterize and unchanged. These are favored as cysts or hemangiomas. The gallbladder surgically absent. No biliary ductal dilatation., Likely a cyst. Pancreas: Unremarkable. No pancreatic ductal dilatation or surrounding inflammatory changes. Spleen: Normal in size without focal abnormality. Adrenals/Urinary Tract: Adrenal glands are unremarkable. Kidneys are normal, without renal calculi, focal lesion, or  hydronephrosis. Bladder is unremarkable. Stomach/Bowel: Patient is status post cholecystectomy. There is some wall thickening of the cecum. There is no evidence for bowel obstruction. Small bowel and stomach appear within normal limits. There is sigmoid colon diverticulosis without evidence for acute diverticulitis. Vascular/Lymphatic: Aortic atherosclerosis. No enlarged abdominal or pelvic lymph nodes. Reproductive: Prostate is unremarkable. Other: There is a small to moderate amount of hyperdense free fluid in the pelvis, right paracolic gutter and left upper quadrant. Findings are most compatible with hemorrhage. Source of hemorrhage is unclear. There is a small amount of free air in the right upper quadrant compatible with recent surgery. There is no focal abdominal wall hernia. There is some stranding and air surrounding the umbilicus likely related to recent surgery. No fluid collections are identified. Musculoskeletal: Mild chronic compression deformity of L1. No acute fractures are seen. IMPRESSION: 1. Status post cholecystectomy. 2. Wall thickening of the cecum which may be reactive colitis secondary to recent surgery. No fluid collections are seen. 3. Small to moderate amount of hemorrhage throughout the abdomen and pelvis of uncertain etiology. 4. Small amount of free air and mild inflammation and air at the level of the umbilicus is compatible with recent surgery. 5. Bibasilar atelectasis with small right pleural effusion. Electronically Signed   By: Ronney Asters M.D.   On: 08/27/2021 22:40   DG Chest Portable 1 View  Result Date: 08/27/2021 CLINICAL DATA:  Appendectomy 3 days ago with continued fever. EXAM: PORTABLE CHEST 1 VIEW COMPARISON:  PA Lat 01/29/2020, chest CT 02/14/2020 FINDINGS: The heart is slightly enlarged. No vascular congestion is seen. No significant pleural effusion. There is a stable mediastinal configuration. There are bilateral perihilar as well as left basilar atelectatic  bands not seen previously. No focal pneumonic consolidation is seen. Mild thoracic spondylosis. IMPRESSION: Perihilar and left basilar atelectatic bands. No appreciable acute infiltrate. Slight cardiomegaly. Electronically Signed   By: Telford Nab M.D.   On: 08/27/2021 22:34    Procedures Procedures   Medications Ordered in ED Medications  sodium chloride 0.9 % bolus 1,000 mL (has no administration in time range)  sodium chloride 0.9 % bolus 1,000 mL (1,000 mLs Intravenous New Bag/Given 08/27/21 2200)  acetaminophen (TYLENOL) tablet 500 mg (500 mg Oral Given 08/27/21 2159)    ED Course  I have reviewed the triage vital signs and the nursing notes.  Pertinent labs & imaging results that were available during my care of the patient were reviewed by me and considered in my medical decision making (see chart for details).    MDM Rules/Calculators/A&P                         Hemoglobin has dropped and he has AKI.  CT shows some hemorrhage in the abdomen.  Pt d/w Dr. Rosendo Gros (surgery) who [facility].  Pt given 1L of NS and he requested an additional liter to be given.  This is done.  As far as the fever goes, no source.  UA is still pending.  CRITICAL CARE Performed by: Isla Pence   Total critical care time: 30 minutes  Critical care time  was exclusive of separately billable procedures and treating other patients.  Critical care was necessary to treat or prevent imminent or life-threatening deterioration.  Critical care was time spent personally by me on the following activities: development of treatment plan with patient and/or surrogate as well as nursing, discussions with consultants, evaluation of patient's response to treatment, examination of patient, obtaining history from patient or surrogate, ordering and performing treatments and interventions, ordering and review of laboratory studies, ordering and review of radiographic studies, pulse oximetry and  re-evaluation of patient's condition.   Final Clinical Impression(s) / ED Diagnoses Final diagnoses:  AKI (acute kidney injury) ([place])  Acute blood loss anemia  S/P appendectomy    Rx / DC Orders ED Discharge Orders     None        Isla Pence, MD 08/27/21 2318

## 2021-08-28 DIAGNOSIS — I10 Essential (primary) hypertension: Secondary | ICD-10-CM | POA: Diagnosis present

## 2021-08-28 DIAGNOSIS — D62 Acute posthemorrhagic anemia: Secondary | ICD-10-CM | POA: Diagnosis present

## 2021-08-28 DIAGNOSIS — E785 Hyperlipidemia, unspecified: Secondary | ICD-10-CM | POA: Diagnosis present

## 2021-08-28 DIAGNOSIS — K91841 Postprocedural hemorrhage and hematoma of a digestive system organ or structure following other procedure: Secondary | ICD-10-CM | POA: Diagnosis present

## 2021-08-28 DIAGNOSIS — Z79899 Other long term (current) drug therapy: Secondary | ICD-10-CM | POA: Diagnosis not present

## 2021-08-28 DIAGNOSIS — N179 Acute kidney failure, unspecified: Secondary | ICD-10-CM | POA: Diagnosis present

## 2021-08-28 DIAGNOSIS — J069 Acute upper respiratory infection, unspecified: Secondary | ICD-10-CM | POA: Diagnosis present

## 2021-08-28 DIAGNOSIS — F319 Bipolar disorder, unspecified: Secondary | ICD-10-CM | POA: Diagnosis present

## 2021-08-28 DIAGNOSIS — Z8249 Family history of ischemic heart disease and other diseases of the circulatory system: Secondary | ICD-10-CM | POA: Diagnosis not present

## 2021-08-28 DIAGNOSIS — Z881 Allergy status to other antibiotic agents status: Secondary | ICD-10-CM | POA: Diagnosis not present

## 2021-08-28 DIAGNOSIS — Z20822 Contact with and (suspected) exposure to covid-19: Secondary | ICD-10-CM | POA: Diagnosis present

## 2021-08-28 DIAGNOSIS — G4730 Sleep apnea, unspecified: Secondary | ICD-10-CM | POA: Diagnosis present

## 2021-08-28 DIAGNOSIS — K661 Hemoperitoneum: Secondary | ICD-10-CM | POA: Diagnosis present

## 2021-08-28 DIAGNOSIS — K5900 Constipation, unspecified: Secondary | ICD-10-CM | POA: Diagnosis present

## 2021-08-28 DIAGNOSIS — Z888 Allergy status to other drugs, medicaments and biological substances status: Secondary | ICD-10-CM | POA: Diagnosis not present

## 2021-08-28 DIAGNOSIS — Z7982 Long term (current) use of aspirin: Secondary | ICD-10-CM | POA: Diagnosis not present

## 2021-08-28 LAB — CBC
HCT: 22 % — ABNORMAL LOW (ref 39.0–52.0)
Hemoglobin: 7.7 g/dL — ABNORMAL LOW (ref 13.0–17.0)
MCH: 32 pg (ref 26.0–34.0)
MCHC: 35 g/dL (ref 30.0–36.0)
MCV: 91.3 fL (ref 80.0–100.0)
Platelets: 252 10*3/uL (ref 150–400)
RBC: 2.41 MIL/uL — ABNORMAL LOW (ref 4.22–5.81)
RDW: 12.4 % (ref 11.5–15.5)
WBC: 8.7 10*3/uL (ref 4.0–10.5)
nRBC: 0 % (ref 0.0–0.2)

## 2021-08-28 LAB — PROTIME-INR
INR: 1.2 (ref 0.8–1.2)
Prothrombin Time: 14.7 seconds (ref 11.4–15.2)

## 2021-08-28 LAB — HEMOGLOBIN AND HEMATOCRIT, BLOOD
HCT: 22.5 % — ABNORMAL LOW (ref 39.0–52.0)
Hemoglobin: 7.8 g/dL — ABNORMAL LOW (ref 13.0–17.0)

## 2021-08-28 MED ORDER — OXYCODONE HCL 5 MG PO TABS: 5.0000 mg | ORAL_TABLET | ORAL | Status: DC | PRN

## 2021-08-28 MED ORDER — SODIUM CHLORIDE 0.9 % IV SOLN: INTRAVENOUS | Status: DC

## 2021-08-28 MED ORDER — MIRTAZAPINE 15 MG PO TABS
60.0000 mg | ORAL_TABLET | Freq: Every day | ORAL | Status: DC
  Administered 2021-08-28: 60 mg via ORAL
  Filled 2021-08-28: qty 4

## 2021-08-28 MED ORDER — ONDANSETRON 4 MG PO TBDP: 4.0000 mg | ORAL_TABLET | Freq: Four times a day (QID) | ORAL | Status: DC | PRN

## 2021-08-28 MED ORDER — ONDANSETRON HCL 4 MG/2ML IJ SOLN: 4.0000 mg | Freq: Four times a day (QID) | INTRAMUSCULAR | Status: DC | PRN

## 2021-08-28 MED ORDER — MIRTAZAPINE 15 MG PO TABS: 45.0000 mg | ORAL_TABLET | Freq: Every day | ORAL | Status: DC

## 2021-08-28 MED ORDER — LAMOTRIGINE 25 MG PO TABS
150.0000 mg | ORAL_TABLET | Freq: Every day | ORAL | Status: DC
  Administered 2021-08-28 – 2021-08-31 (×4): 150 mg via ORAL
  Filled 2021-08-28 (×4): qty 2

## 2021-08-28 MED ORDER — MIRTAZAPINE 15 MG PO TABS
15.0000 mg | ORAL_TABLET | Freq: Every day | ORAL | Status: DC
  Filled 2021-08-28: qty 1

## 2021-08-28 MED ORDER — AMLODIPINE BESYLATE 10 MG PO TABS
10.0000 mg | ORAL_TABLET | Freq: Every day | ORAL | Status: DC
  Administered 2021-08-28 – 2021-08-31 (×4): 10 mg via ORAL
  Filled 2021-08-28 (×4): qty 1

## 2021-08-28 MED ORDER — MIRTAZAPINE 30 MG PO TBDP: 45.0000 mg | ORAL_TABLET | Freq: Every day | ORAL | Status: DC

## 2021-08-28 MED ORDER — ACETAMINOPHEN 325 MG PO TABS
650.0000 mg | ORAL_TABLET | Freq: Four times a day (QID) | ORAL | Status: DC | PRN
  Administered 2021-08-29: 18:00:00 325 mg via ORAL
  Administered 2021-08-30 – 2021-08-31 (×2): 650 mg via ORAL
  Filled 2021-08-28 (×4): qty 2

## 2021-08-28 MED ORDER — ROSUVASTATIN CALCIUM 5 MG PO TABS
10.0000 mg | ORAL_TABLET | Freq: Every day | ORAL | Status: DC
  Administered 2021-08-28 – 2021-08-30 (×3): 10 mg via ORAL
  Filled 2021-08-28 (×3): qty 2

## 2021-08-28 MED ORDER — ALPRAZOLAM 0.25 MG PO TABS
0.1250 mg | ORAL_TABLET | Freq: Three times a day (TID) | ORAL | Status: DC
  Administered 2021-08-28 – 2021-08-31 (×11): 0.125 mg via ORAL
  Filled 2021-08-28 (×11): qty 1

## 2021-08-28 NOTE — Plan of Care (Signed)
  Problem: Nutrition: Goal: Adequate nutrition will be maintained Outcome: Progressing   Problem: Pain Managment: Goal: General experience of comfort will improve Outcome: Progressing   

## 2021-08-28 NOTE — H&P (Signed)
Cc: had surgery Tuesday  HPI: VERNIE PIET is an 55 y.o. male whom is well known to our service - underwent laparoscopic appendectomy 08/24/21 for acute appendicitis without evident perforation. He was discharged home following. Returned to ED last night with vague lower quadrant pains. He underwent evaluation at [facility] and was found to have low grade temp and acute blood loss anemia. CT scan a/p 08/27/21 demonstrated small to moderate hemorrhage in RLQ/pelvis. Small amount of free air 3 days out from surgery.  He was discussed with my partner Dr. Rosendo Gros and admitted by him overnight. I was asked to see him this morning.  Reports he feels well and now has appetite. Denies n/v. Tolerating breakfast.  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 x 2 yr.   Mucoid cyst of joint 03/2013   right middle finger   Sleep apnea with use of continuous positive airway pressure (CPAP)     Past Surgical History:  Procedure Laterality Date   CHOLECYSTECTOMY  2000s   FINGER SURGERY Right 03/2013   right middle finger-cyst removed   LAPAROSCOPIC APPENDECTOMY N/A 08/24/2021   Procedure: APPENDECTOMY LAPAROSCOPIC;  Surgeon: Ralene Ok, MD;  Location: [facility];  Service: General;  Laterality: N/A;   [place]     as a child    Family History  Problem Relation Age of Onset   Lung cancer Father    AAA (abdominal aortic aneurysm) Father    Heart attack Paternal Grandfather 64   AAA (abdominal aortic aneurysm) Paternal Grandfather        Died from this age 24    Social:  reports that he has never smoked. He has been exposed to tobacco smoke. He has never used smokeless tobacco. He reports that  he does not currently use alcohol. He reports that he does not use drugs.  Allergies:  Allergies  Allergen Reactions   Nuvigil [Armodafinil]     Anxiety and tinnitis 2014 after taking just 2 tabs.   Lac Bovis Other (See Comments)    GI UPSET   Smallpox Vaccine Other (See Comments)   Doxycycline Other (See Comments)    unknown   Gluten Meal Other (See Comments)    GI UPSET   Milk-Related Compounds Other (See Comments)    GI UPSET    Medications: I have reviewed the patient's current medications.  Results for orders placed or performed during the hospital encounter of 08/27/21 (from the past 48 hour(s))  CBC with Differential     Status: Abnormal   Collection Time: 08/27/21  9:05 PM  Result Value Ref Range   WBC 10.1 4.0 - 10.5 K/uL   RBC 2.78 (L) 4.22 - 5.81 MIL/uL   Hemoglobin 8.6 (L) 13.0 - 17.0 g/dL   HCT 24.8 (L) 39.0 - 52.0 %   MCV 89.2 80.0 - 100.0 fL   MCH 30.9 26.0 - 34.0 pg   MCHC 34.7 30.0 - 36.0 g/dL   RDW 12.3 11.5 - 15.5 %   Platelets 288 150 - 400 K/uL   nRBC 0.0 0.0 - 0.2 %   Neutrophils Relative %  70 %   Neutro Abs 7.1 1.7 - 7.7 K/uL   Lymphocytes Relative 16 %   Lymphs Abs 1.6 0.7 - 4.0 K/uL   Monocytes Relative 12 %   Monocytes Absolute 1.2 (H) 0.1 - 1.0 K/uL   Eosinophils Relative 1 %   Eosinophils Absolute 0.1 0.0 - 0.5 K/uL   Basophils Relative 1 %   Basophils Absolute 0.1 0.0 - 0.1 K/uL   Immature Granulocytes 0 %   Abs Immature Granulocytes 0.03 0.00 - 0.07 K/uL    Comment: Performed at [facility], [address], [place], [place] [address]  Comprehensive metabolic panel     Status: Abnormal   Collection Time: 08/27/21  9:05 PM  Result Value Ref Range   Sodium 134 (L) 135 - 145 mmol/L   Potassium 3.6 3.5 - 5.1 mmol/L   Chloride 99 98 - 111 mmol/L   CO2 23 22 - 32 mmol/L   Glucose, Bld 143 (H) 70 - 99 mg/dL    Comment: Glucose reference range applies only to samples taken after fasting for at least 8 hours.   BUN 43  (H) 6 - 20 mg/dL   Creatinine, Ser 2.27 (H) 0.61 - 1.24 mg/dL   Calcium 8.7 (L) 8.9 - 10.3 mg/dL   Total Protein 6.7 6.5 - 8.1 g/dL   Albumin 3.9 3.5 - 5.0 g/dL   AST 43 (H) 15 - 41 U/L   ALT 62 (H) 0 - 44 U/L   Alkaline Phosphatase 58 38 - 126 U/L   Total Bilirubin 0.9 0.3 - 1.2 mg/dL   GFR, Estimated 33 (L) >60 mL/min    Comment: (NOTE) Calculated using the CKD-EPI Creatinine Equation (2021)    Anion gap 12 5 - 15    Comment: Performed at [facility], [address], [place], [place] [address]  Resp Panel by RT-PCR (Flu A&B, Covid) Nasopharyngeal Swab     Status: None   Collection Time: 08/27/21  9:26 PM   Specimen: Nasopharyngeal Swab; Nasopharyngeal(NP) swabs in vial transport medium  Result Value Ref Range   SARS Coronavirus 2 by RT PCR NEGATIVE NEGATIVE    Comment: (NOTE) SARS-CoV-2 target nucleic acids are NOT DETECTED.  The SARS-CoV-2 RNA is generally detectable in upper respiratory specimens during the acute phase of infection. The lowest concentration of SARS-CoV-2 viral copies this assay can detect is 138 copies/mL. A negative result does not preclude SARS-Cov-2 infection and should not be used as the sole basis for treatment or other patient management decisions. A negative result may occur with  improper specimen collection/handling, submission of specimen other than nasopharyngeal swab, presence of viral mutation(s) within the areas targeted by this assay, and inadequate number of viral copies(<138 copies/mL). A negative result must be combined with clinical observations, patient history, and epidemiological information. The expected result is Negative.  Fact Sheet for Patients:  EntrepreneurPulse.com.au  Fact Sheet for Healthcare Providers:  IncredibleEmployment.be  This test is no t yet approved or cleared by the [place] FDA and  has been authorized for detection and/or diagnosis of SARS-CoV-2  by FDA under an Emergency Use Authorization (EUA). This EUA will remain  in effect (meaning this test can be used) for the duration of the COVID-19 declaration under Section 564(b)(1) of the Act, 21 U.S.C.section 360bbb-3(b)(1), unless the authorization is terminated  or revoked sooner.       Influenza A by PCR NEGATIVE NEGATIVE   Influenza B by PCR NEGATIVE NEGATIVE    Comment: (NOTE)  The Xpert Xpress SARS-CoV-2/FLU/RSV plus assay is intended as an aid in the diagnosis of influenza from Nasopharyngeal swab specimens and should not be used as a sole basis for treatment. Nasal washings and aspirates are unacceptable for Xpert Xpress SARS-CoV-2/FLU/RSV testing.  Fact Sheet for Patients: EntrepreneurPulse.com.au  Fact Sheet for Healthcare Providers: IncredibleEmployment.be  This test is not yet approved or cleared by the [place] FDA and has been authorized for detection and/or diagnosis of SARS-CoV-2 by FDA under an Emergency Use Authorization (EUA). This EUA will remain in effect (meaning this test can be used) for the duration of the COVID-19 declaration under Section 564(b)(1) of the Act, 21 U.S.C. section 360bbb-3(b)(1), unless the authorization is terminated or revoked.  Performed at [facility], [address], [place], [place] [address]   Urinalysis, Routine w reflex microscopic Urine, Clean Catch     Status: Abnormal   Collection Time: 08/27/21 11:44 PM  Result Value Ref Range   Color, Urine COLORLESS (A) YELLOW   APPearance CLEAR CLEAR   Specific Gravity, Urine 1.005 1.005 - 1.030   pH 5.5 5.0 - 8.0   Glucose, UA NEGATIVE NEGATIVE mg/dL   Hgb urine dipstick NEGATIVE NEGATIVE   Bilirubin Urine NEGATIVE NEGATIVE   Ketones, ur NEGATIVE NEGATIVE mg/dL   Protein, ur NEGATIVE NEGATIVE mg/dL   Nitrite NEGATIVE NEGATIVE   Leukocytes,Ua NEGATIVE NEGATIVE    Comment: Performed at [facility],  [place], [place] [address]  CBC     Status: Abnormal   Collection Time: 08/28/21  4:41 AM  Result Value Ref Range   WBC 8.7 4.0 - 10.5 K/uL   RBC 2.41 (L) 4.22 - 5.81 MIL/uL   Hemoglobin 7.7 (L) 13.0 - 17.0 g/dL   HCT 22.0 (L) 39.0 - 52.0 %   MCV 91.3 80.0 - 100.0 fL   MCH 32.0 26.0 - 34.0 pg   MCHC 35.0 30.0 - 36.0 g/dL   RDW 12.4 11.5 - 15.5 %   Platelets 252 150 - 400 K/uL   nRBC 0.0 0.0 - 0.2 %    Comment: Performed at [facility], [place] [address] [place], [place] [address]  Protime-INR     Status: None   Collection Time: 08/28/21  4:41 AM  Result Value Ref Range   Prothrombin Time 14.7 11.4 - 15.2 seconds   INR 1.2 0.8 - 1.2    Comment: (NOTE) INR goal varies based on device and disease states. Performed at [facility], [place] [address], [place], [place] [address]     CT ABDOMEN PELVIS WO CONTRAST  Result Date: 08/27/2021 CLINICAL DATA:  Status post appendectomy.  Abdominal pain and fever. EXAM: CT ABDOMEN AND PELVIS WITHOUT CONTRAST TECHNIQUE: Multidetector CT imaging of the abdomen and pelvis was performed following the standard protocol without IV contrast. COMPARISON:  CT abdomen and pelvis 08/24/2021 FINDINGS: Lower chest: There are atelectatic changes in both lung bases. There is a trace right pleural effusion. Hepatobiliary: There are rounded hypodensities in the liver which are too small to characterize and unchanged. These are favored as cysts or hemangiomas. The gallbladder surgically absent. No biliary ductal dilatation., Likely a cyst. Pancreas: Unremarkable. No pancreatic ductal dilatation or surrounding inflammatory changes. Spleen: Normal in size without focal abnormality. Adrenals/Urinary Tract: Adrenal glands are unremarkable. Kidneys are normal, without renal calculi, focal lesion, or hydronephrosis. Bladder is unremarkable. Stomach/Bowel: Patient is status post cholecystectomy. There is some wall thickening of the cecum. There is no  evidence for bowel obstruction. Small bowel and stomach appear within normal limits. There is sigmoid colon diverticulosis without evidence for acute diverticulitis. Vascular/Lymphatic: Aortic atherosclerosis. No enlarged abdominal or pelvic lymph nodes. Reproductive: Prostate is unremarkable. Other: There is a small to moderate amount of hyperdense free fluid in the pelvis, right paracolic gutter and left upper quadrant. Findings are most compatible with hemorrhage. Source of hemorrhage is unclear. There is a small amount of free air in the right upper quadrant compatible with recent surgery. There is no focal abdominal wall hernia. There is some stranding and air surrounding the umbilicus likely related to recent surgery. No fluid collections are identified. Musculoskeletal: Mild chronic compression deformity of L1. No acute fractures are seen. IMPRESSION: 1. Status post cholecystectomy. 2. Wall thickening of the cecum which may be reactive colitis secondary to recent surgery. No fluid collections are seen. 3. Small to moderate amount of hemorrhage throughout the abdomen and pelvis of uncertain etiology. 4. Small amount of free air and mild inflammation and air at the level of the umbilicus is compatible with recent surgery. 5. Bibasilar atelectasis with small right pleural effusion. Electronically Signed   By: Ronney Asters M.D.   On: 08/27/2021 22:40   DG Chest Portable 1 View  Result Date: 08/27/2021 CLINICAL DATA:  Appendectomy 3 days ago with continued fever. EXAM: PORTABLE CHEST 1 VIEW COMPARISON:  PA Lat 01/29/2020, chest CT 02/14/2020 FINDINGS: The heart is slightly enlarged. No vascular congestion is seen. No significant pleural effusion. There is a stable mediastinal configuration. There are bilateral perihilar as well as left basilar atelectatic bands not seen previously. No focal pneumonic consolidation is seen. Mild thoracic spondylosis. IMPRESSION: Perihilar and left basilar atelectatic bands.  No appreciable acute infiltrate. Slight cardiomegaly. Electronically Signed   By: Telford Nab M.D.   On: 08/27/2021 22:34    ROS - all of the below systems have been reviewed with the patient and positives are indicated with bold text General: chills, fever or night sweats Eyes: blurry vision or double vision ENT: epistaxis or sore throat Allergy/Immunology: itchy/watery eyes or nasal congestion Hematologic/Lymphatic: bleeding problems, blood clots or swollen lymph nodes Endocrine: temperature intolerance or unexpected weight changes Breast: new or changing breast lumps or nipple discharge Resp: cough, shortness of breath, or wheezing CV: chest pain or dyspnea on exertion GI: as per HPI GU: dysuria, trouble voiding, or hematuria MSK: joint pain or joint stiffness Neuro: TIA or stroke symptoms Derm: pruritus and skin lesion changes Psych: anxiety and depression  PE Blood pressure (!) 110/58, pulse 81, temperature 99.7 F (37.6 C), temperature source Oral, resp. rate 18, height 6' (1.829 m), weight 99.8 kg, SpO2 94 %. Constitutional: NAD; conversant; wearing mask Eyes: Moist conjunctiva; no lid lag; anicteric Lungs: Normal respiratory effort; CV: RRR GI: Abd soft, not significantly tender nor distended; no rebound no guarding; no significant erythema or drainage from incisions. Psychiatric: Appropriate affect; alert and oriented x3  Results for orders placed or performed during the hospital encounter of 08/27/21 (from the past 48 hour(s))  CBC with Differential     Status: Abnormal   Collection Time: 08/27/21  9:05 PM  Result Value Ref Range   WBC 10.1 4.0 - 10.5 K/uL   RBC 2.78 (L) 4.22 - 5.81 MIL/uL   Hemoglobin 8.6 (L) 13.0 - 17.0 g/dL   HCT 24.8 (L) 39.0 - 52.0 %   MCV 89.2 80.0 - 100.0 fL   MCH 30.9 26.0 - 34.0 pg   MCHC 34.7 30.0 -  36.0 g/dL   RDW 12.3 11.5 - 15.5 %   Platelets 288 150 - 400 K/uL   nRBC 0.0 0.0 - 0.2 %   Neutrophils Relative % 70 %   Neutro Abs 7.1  1.7 - 7.7 K/uL   Lymphocytes Relative 16 %   Lymphs Abs 1.6 0.7 - 4.0 K/uL   Monocytes Relative 12 %   Monocytes Absolute 1.2 (H) 0.1 - 1.0 K/uL   Eosinophils Relative 1 %   Eosinophils Absolute 0.1 0.0 - 0.5 K/uL   Basophils Relative 1 %   Basophils Absolute 0.1 0.0 - 0.1 K/uL   Immature Granulocytes 0 %   Abs Immature Granulocytes 0.03 0.00 - 0.07 K/uL    Comment: Performed at [facility], [place], [place] [address]  Comprehensive metabolic panel     Status: Abnormal   Collection Time: 08/27/21  9:05 PM  Result Value Ref Range   Sodium 134 (L) 135 - 145 mmol/L   Potassium 3.6 3.5 - 5.1 mmol/L   Chloride 99 98 - 111 mmol/L   CO2 23 22 - 32 mmol/L   Glucose, Bld 143 (H) 70 - 99 mg/dL    Comment: Glucose reference range applies only to samples taken after fasting for at least 8 hours.   BUN 43 (H) 6 - 20 mg/dL   Creatinine, Ser 2.27 (H) 0.61 - 1.24 mg/dL   Calcium 8.7 (L) 8.9 - 10.3 mg/dL   Total Protein 6.7 6.5 - 8.1 g/dL   Albumin 3.9 3.5 - 5.0 g/dL   AST 43 (H) 15 - 41 U/L   ALT 62 (H) 0 - 44 U/L   Alkaline Phosphatase 58 38 - 126 U/L   Total Bilirubin 0.9 0.3 - 1.2 mg/dL   GFR, Estimated 33 (L) >60 mL/min    Comment: (NOTE) Calculated using the CKD-EPI Creatinine Equation (2021)    Anion gap 12 5 - 15    Comment: Performed at [facility], [address], [place], [place] [address]  Resp Panel by RT-PCR (Flu A&B, Covid) Nasopharyngeal Swab     Status: None   Collection Time: 08/27/21  9:26 PM   Specimen: Nasopharyngeal Swab; Nasopharyngeal(NP) swabs in vial transport medium  Result Value Ref Range   SARS Coronavirus 2 by RT PCR NEGATIVE NEGATIVE    Comment: (NOTE) SARS-CoV-2 target nucleic acids are NOT DETECTED.  The SARS-CoV-2 RNA is generally detectable in upper respiratory specimens during the acute phase of infection. The lowest concentration of SARS-CoV-2 viral copies this assay can detect is 138  copies/mL. A negative result does not preclude SARS-Cov-2 infection and should not be used as the sole basis for treatment or other patient management decisions. A negative result may occur with  improper specimen collection/handling, submission of specimen other than nasopharyngeal swab, presence of viral mutation(s) within the areas targeted by this assay, and inadequate number of viral copies(<138 copies/mL). A negative result must be combined with clinical observations, patient history, and epidemiological information. The expected result is Negative.  Fact Sheet for Patients:  EntrepreneurPulse.com.au  Fact Sheet for Healthcare Providers:  IncredibleEmployment.be  This test is no t yet approved or cleared by the [place] FDA and  has been authorized for detection and/or diagnosis of SARS-CoV-2 by FDA under an Emergency Use Authorization (EUA). This EUA will remain  in effect (meaning this test can be used) for the duration of the COVID-19 declaration under Section 564(b)(1) of the Act, 21 U.S.C.section 360bbb-3(b)(1), unless the authorization  is terminated  or revoked sooner.       Influenza A by PCR NEGATIVE NEGATIVE   Influenza B by PCR NEGATIVE NEGATIVE    Comment: (NOTE) The Xpert Xpress SARS-CoV-2/FLU/RSV plus assay is intended as an aid in the diagnosis of influenza from Nasopharyngeal swab specimens and should not be used as a sole basis for treatment. Nasal washings and aspirates are unacceptable for Xpert Xpress SARS-CoV-2/FLU/RSV testing.  Fact Sheet for Patients: EntrepreneurPulse.com.au  Fact Sheet for Healthcare Providers: IncredibleEmployment.be  This test is not yet approved or cleared by the [place] FDA and has been authorized for detection and/or diagnosis of SARS-CoV-2 by FDA under an Emergency Use Authorization (EUA). This EUA will remain in effect (meaning this test  can be used) for the duration of the COVID-19 declaration under Section 564(b)(1) of the Act, 21 U.S.C. section 360bbb-3(b)(1), unless the authorization is terminated or revoked.  Performed at [facility], [address] [place], [place] [address]   Urinalysis, Routine w reflex microscopic Urine, Clean Catch     Status: Abnormal   Collection Time: 08/27/21 11:44 PM  Result Value Ref Range   Color, Urine COLORLESS (A) YELLOW   APPearance CLEAR CLEAR   Specific Gravity, Urine 1.005 1.005 - 1.030   pH 5.5 5.0 - 8.0   Glucose, UA NEGATIVE NEGATIVE mg/dL   Hgb urine dipstick NEGATIVE NEGATIVE   Bilirubin Urine NEGATIVE NEGATIVE   Ketones, ur NEGATIVE NEGATIVE mg/dL   Protein, ur NEGATIVE NEGATIVE mg/dL   Nitrite NEGATIVE NEGATIVE   Leukocytes,Ua NEGATIVE NEGATIVE    Comment: Performed at [facility], [place], [place] [address]  CBC     Status: Abnormal   Collection Time: 08/28/21  4:41 AM  Result Value Ref Range   WBC 8.7 4.0 - 10.5 K/uL   RBC 2.41 (L) 4.22 - 5.81 MIL/uL   Hemoglobin 7.7 (L) 13.0 - 17.0 g/dL   HCT 22.0 (L) 39.0 - 52.0 %   MCV 91.3 80.0 - 100.0 fL   MCH 32.0 26.0 - 34.0 pg   MCHC 35.0 30.0 - 36.0 g/dL   RDW 12.4 11.5 - 15.5 %   Platelets 252 150 - 400 K/uL   nRBC 0.0 0.0 - 0.2 %    Comment: Performed at [facility], [place] [address], [place], [place] [address]  Protime-INR     Status: None   Collection Time: 08/28/21  4:41 AM  Result Value Ref Range   Prothrombin Time 14.7 11.4 - 15.2 seconds   INR 1.2 0.8 - 1.2    Comment: (NOTE) INR goal varies based on device and disease states. Performed at [facility], [place] [address] [place], [place] [address]     CT ABDOMEN PELVIS WO CONTRAST  Result Date: 08/27/2021 CLINICAL DATA:  Status post appendectomy.  Abdominal pain and fever. EXAM: CT ABDOMEN AND PELVIS WITHOUT CONTRAST TECHNIQUE: Multidetector CT imaging of the abdomen and pelvis was  performed following the standard protocol without IV contrast. COMPARISON:  CT abdomen and pelvis 08/24/2021 FINDINGS: Lower chest: There are atelectatic changes in both lung bases. There is a trace right pleural effusion. Hepatobiliary: There are rounded hypodensities in the liver which are too small to characterize and unchanged. These are favored as cysts or hemangiomas. The gallbladder surgically absent. No biliary ductal dilatation., Likely a cyst. Pancreas: Unremarkable. No pancreatic ductal dilatation or surrounding inflammatory changes. Spleen: Normal in size without focal abnormality. Adrenals/Urinary Tract: Adrenal glands are  unremarkable. Kidneys are normal, without renal calculi, focal lesion, or hydronephrosis. Bladder is unremarkable. Stomach/Bowel: Patient is status post cholecystectomy. There is some wall thickening of the cecum. There is no evidence for bowel obstruction. Small bowel and stomach appear within normal limits. There is sigmoid colon diverticulosis without evidence for acute diverticulitis. Vascular/Lymphatic: Aortic atherosclerosis. No enlarged abdominal or pelvic lymph nodes. Reproductive: Prostate is unremarkable. Other: There is a small to moderate amount of hyperdense free fluid in the pelvis, right paracolic gutter and left upper quadrant. Findings are most compatible with hemorrhage. Source of hemorrhage is unclear. There is a small amount of free air in the right upper quadrant compatible with recent surgery. There is no focal abdominal wall hernia. There is some stranding and air surrounding the umbilicus likely related to recent surgery. No fluid collections are identified. Musculoskeletal: Mild chronic compression deformity of L1. No acute fractures are seen. IMPRESSION: 1. Status post cholecystectomy. 2. Wall thickening of the cecum which may be reactive colitis secondary to recent surgery. No fluid collections are seen. 3. Small to moderate amount of hemorrhage throughout  the abdomen and pelvis of uncertain etiology. 4. Small amount of free air and mild inflammation and air at the level of the umbilicus is compatible with recent surgery. 5. Bibasilar atelectasis with small right pleural effusion. Electronically Signed   By: Ronney Asters M.D.   On: 08/27/2021 22:40   DG Chest Portable 1 View  Result Date: 08/27/2021 CLINICAL DATA:  Appendectomy 3 days ago with continued fever. EXAM: PORTABLE CHEST 1 VIEW COMPARISON:  PA Lat 01/29/2020, chest CT 02/14/2020 FINDINGS: The heart is slightly enlarged. No vascular congestion is seen. No significant pleural effusion. There is a stable mediastinal configuration. There are bilateral perihilar as well as left basilar atelectatic bands not seen previously. No focal pneumonic consolidation is seen. Mild thoracic spondylosis. IMPRESSION: Perihilar and left basilar atelectatic bands. No appreciable acute infiltrate. Slight cardiomegaly. Electronically Signed   By: Telford Nab M.D.   On: 08/27/2021 22:34     A/P: JEROMIE GAINOR is an 55 y.o. male s/p lap appy 08/24/21, now with small to moderate hemoperitoneum  -Hemodynamically stable; feeling well; appetite -Acute blood loss anemia - hgb 7.7 <-- 8.6<--15.4 - monitor; continue MIVF. Suspect this has stopped and is equilibrating at this point -Acute kidney injury - repeat labs in AM; continue supportive IVF - NS @ 125cc/hr, in addition to PO intake -Spent time reviewing above with him, answering questions and he has expressed agreement with plan moving forward -Ppx: SCDs; no chemical dvt ppx at this time given above  Nadeen Landau, MD [facility] Use [email or web address] to contact on call provider

## 2021-08-28 NOTE — ED Notes (Signed)
Called to check on bed status at 12:40 am was advised room is still being cleaned

## 2021-08-28 NOTE — ED Notes (Signed)
Called [facility] to transport patient to [facility] 6N room 5

## 2021-08-28 NOTE — Progress Notes (Signed)
Pt refused CPAP for the night. Pt states that he needs his equipment to wear it at night. Vitals are stable and RT will continue to monitor as needed.

## 2021-08-28 NOTE — ED Notes (Signed)
Called [facility] to transport patient to [facility] 6N room 5

## 2021-08-29 LAB — BASIC METABOLIC PANEL
Anion gap: 7 (ref 5–15)
BUN: 12 mg/dL (ref 6–20)
CO2: 24 mmol/L (ref 22–32)
Calcium: 8 mg/dL — ABNORMAL LOW (ref 8.9–10.3)
Chloride: 108 mmol/L (ref 98–111)
Creatinine, Ser: 1.09 mg/dL (ref 0.61–1.24)
GFR, Estimated: >60 mL/min (ref >60)
Glucose, Bld: 122 mg/dL — ABNORMAL HIGH (ref 70–99)
Potassium: 3.7 mmol/L (ref 3.5–5.1)
Sodium: 139 mmol/L (ref 135–145)

## 2021-08-29 LAB — CBC WITH DIFFERENTIAL/PLATELET
Abs Immature Granulocytes: 0.05 10*3/uL (ref 0.00–0.07)
Basophils Absolute: 0 10*3/uL (ref 0.0–0.1)
Basophils Relative: 1 %
Eosinophils Absolute: 0.2 10*3/uL (ref 0.0–0.5)
Eosinophils Relative: 2 %
HCT: 21.6 % — ABNORMAL LOW (ref 39.0–52.0)
Hemoglobin: 7.7 g/dL — ABNORMAL LOW (ref 13.0–17.0)
Immature Granulocytes: 1 %
Lymphocytes Relative: 24 %
Lymphs Abs: 2 10*3/uL (ref 0.7–4.0)
MCH: 32.6 pg (ref 26.0–34.0)
MCHC: 35.6 g/dL (ref 30.0–36.0)
MCV: 91.5 fL (ref 80.0–100.0)
Monocytes Absolute: 1.1 10*3/uL — ABNORMAL HIGH (ref 0.1–1.0)
Monocytes Relative: 13 %
Neutro Abs: 5 10*3/uL (ref 1.7–7.7)
Neutrophils Relative %: 59 %
Platelets: 245 10*3/uL (ref 150–400)
RBC: 2.36 MIL/uL — ABNORMAL LOW (ref 4.22–5.81)
RDW: 12.1 % (ref 11.5–15.5)
WBC: 8.4 10*3/uL (ref 4.0–10.5)
nRBC: 0 % (ref 0.0–0.2)

## 2021-08-29 MED ORDER — MIRTAZAPINE 15 MG PO TABS
60.0000 mg | ORAL_TABLET | Freq: Every day | ORAL | Status: DC
  Administered 2021-08-29 – 2021-08-30 (×3): 60 mg via ORAL
  Filled 2021-08-29 (×3): qty 4

## 2021-08-29 MED ORDER — POLYETHYLENE GLYCOL 3350 17 G PO PACK
17.0000 g | PACK | Freq: Every day | ORAL | Status: DC
  Administered 2021-08-29 – 2021-08-31 (×3): 17 g via ORAL
  Filled 2021-08-29 (×3): qty 1

## 2021-08-29 MED ORDER — IBUPROFEN 400 MG PO TABS
400.0000 mg | ORAL_TABLET | Freq: Four times a day (QID) | ORAL | Status: DC | PRN
  Administered 2021-08-29: 14:00:00 400 mg via ORAL
  Filled 2021-08-29: qty 1

## 2021-08-29 NOTE — Progress Notes (Signed)
Subjective/Chief Complaint: Intermittent abdominal soreness - mostly with movements Flatus, No BM since 12/12 Hgb stable Mild fever overnight WBC normal Good UOP   Objective: Vital signs in last 24 hours: Temp:  [99.4 F (37.4 C)-100.1 F (37.8 C)] 100.1 F (37.8 C) (12/18 0811) Pulse Rate:  [76-93] 76 (12/18 0811) Resp:  [16-19] 19 (12/18 0811) BP: (115-121)/(62-67) 115/66 (12/18 0811) SpO2:  [95 %-97 %] 96 % (12/18 0811) Last BM Date: 08/24/21  Intake/Output from previous day: 12/17 0701 - 12/18 0700 In: 925.6 [P.O.:240; I.V.:685.6] Out: -  Intake/Output this shift: No intake/output data recorded.  WDWN in NAD Abd - soft, mild incisional tenderness Mild ecchymosis around umbilicus Incisions c/d/i Lab Results:  Recent Labs    08/28/21 0441 08/28/21 1945 08/29/21 0040  WBC 8.7  --  8.4  HGB 7.7* 7.8* 7.7*  HCT 22.0* 22.5* 21.6*  PLT 252  --  245   BMET Recent Labs    08/27/21 2105 08/29/21 0040  NA 134* 139  K 3.6 3.7  CL 99 108  CO2 23 24  GLUCOSE 143* 122*  BUN 43* 12  CREATININE 2.27* 1.09  CALCIUM 8.7* 8.0*   PT/INR Recent Labs    08/28/21 0441  LABPROT 14.7  INR 1.2   ABG No results for input(s): PHART, HCO3 in the last 72 hours.  Invalid input(s): PCO2, PO2  Studies/Results: CT ABDOMEN PELVIS WO CONTRAST  Result Date: 08/27/2021 CLINICAL DATA:  Status post appendectomy.  Abdominal pain and fever. EXAM: CT ABDOMEN AND PELVIS WITHOUT CONTRAST TECHNIQUE: Multidetector CT imaging of the abdomen and pelvis was performed following the standard protocol without IV contrast. COMPARISON:  CT abdomen and pelvis 08/24/2021 FINDINGS: Lower chest: There are atelectatic changes in both lung bases. There is a trace right pleural effusion. Hepatobiliary: There are rounded hypodensities in the liver which are too small to characterize and unchanged. These are favored as cysts or hemangiomas. The gallbladder surgically absent. No biliary ductal  dilatation., Likely a cyst. Pancreas: Unremarkable. No pancreatic ductal dilatation or surrounding inflammatory changes. Spleen: Normal in size without focal abnormality. Adrenals/Urinary Tract: Adrenal glands are unremarkable. Kidneys are normal, without renal calculi, focal lesion, or hydronephrosis. Bladder is unremarkable. Stomach/Bowel: Patient is status post cholecystectomy. There is some wall thickening of the cecum. There is no evidence for bowel obstruction. Small bowel and stomach appear within normal limits. There is sigmoid colon diverticulosis without evidence for acute diverticulitis. Vascular/Lymphatic: Aortic atherosclerosis. No enlarged abdominal or pelvic lymph nodes. Reproductive: Prostate is unremarkable. Other: There is a small to moderate amount of hyperdense free fluid in the pelvis, right paracolic gutter and left upper quadrant. Findings are most compatible with hemorrhage. Source of hemorrhage is unclear. There is a small amount of free air in the right upper quadrant compatible with recent surgery. There is no focal abdominal wall hernia. There is some stranding and air surrounding the umbilicus likely related to recent surgery. No fluid collections are identified. Musculoskeletal: Mild chronic compression deformity of L1. No acute fractures are seen. IMPRESSION: 1. Status post cholecystectomy. 2. Wall thickening of the cecum which may be reactive colitis secondary to recent surgery. No fluid collections are seen. 3. Small to moderate amount of hemorrhage throughout the abdomen and pelvis of uncertain etiology. 4. Small amount of free air and mild inflammation and air at the level of the umbilicus is compatible with recent surgery. 5. Bibasilar atelectasis with small right pleural effusion. Electronically Signed   By: Tina Griffiths.D.  On: 08/27/2021 22:40   DG Chest Portable 1 View  Result Date: 08/27/2021 CLINICAL DATA:  Appendectomy 3 days ago with continued fever. EXAM:  PORTABLE CHEST 1 VIEW COMPARISON:  PA Lat 01/29/2020, chest CT 02/14/2020 FINDINGS: The heart is slightly enlarged. No vascular congestion is seen. No significant pleural effusion. There is a stable mediastinal configuration. There are bilateral perihilar as well as left basilar atelectatic bands not seen previously. No focal pneumonic consolidation is seen. Mild thoracic spondylosis. IMPRESSION: Perihilar and left basilar atelectatic bands. No appreciable acute infiltrate. Slight cardiomegaly. Electronically Signed   By: Telford Nab M.D.   On: 08/27/2021 22:34    Anti-infectives: Anti-infectives (From admission, onward)    None       Assessment/Plan: S/p laparoscopic appendectomy 08/24/21 Dr. Rosendo Gros Readmitted 08/28/21 with hemoperitoneum - hemodynamically stable; Hgb has equilibrated Constipation - Miralax today Renal function improved after hydration Encourage incentive spirometer Repeat labs in AM Probable discharge tomorrow.    LOS: 1 day    Maia Petties 08/29/2021

## 2021-08-29 NOTE — Progress Notes (Signed)
Pt not interested in using CPAP this admission. Order discontinued per RT protocol.

## 2021-08-29 NOTE — Plan of Care (Signed)
  Problem: Nutrition: Goal: Adequate nutrition will be maintained Outcome: Progressing   Problem: Pain Managment: Goal: General experience of comfort will improve Outcome: Progressing   

## 2021-08-30 ENCOUNTER — Inpatient Hospital Stay (HOSPITAL_COMMUNITY): Payer: BC Managed Care – PPO

## 2021-08-30 LAB — CBC
HCT: 22.9 % — ABNORMAL LOW (ref 39.0–52.0)
Hemoglobin: 8.1 g/dL — ABNORMAL LOW (ref 13.0–17.0)
MCH: 32.1 pg (ref 26.0–34.0)
MCHC: 35.4 g/dL (ref 30.0–36.0)
MCV: 90.9 fL (ref 80.0–100.0)
Platelets: 276 10*3/uL (ref 150–400)
RBC: 2.52 MIL/uL — ABNORMAL LOW (ref 4.22–5.81)
RDW: 12.2 % (ref 11.5–15.5)
WBC: 9.4 10*3/uL (ref 4.0–10.5)
nRBC: 0 % (ref 0.0–0.2)

## 2021-08-30 LAB — RESP PANEL BY RT-PCR (FLU A&B, COVID) ARPGX2
Influenza A by PCR: NEGATIVE
Influenza B by PCR: NEGATIVE
SARS Coronavirus 2 by RT PCR: NEGATIVE

## 2021-08-30 LAB — BASIC METABOLIC PANEL
Anion gap: 9 (ref 5–15)
BUN: 8 mg/dL (ref 6–20)
CO2: 25 mmol/L (ref 22–32)
Calcium: 8.2 mg/dL — ABNORMAL LOW (ref 8.9–10.3)
Chloride: 103 mmol/L (ref 98–111)
Creatinine, Ser: 0.98 mg/dL (ref 0.61–1.24)
GFR, Estimated: >60 mL/min (ref >60)
Glucose, Bld: 144 mg/dL — ABNORMAL HIGH (ref 70–99)
Potassium: 3.6 mmol/L (ref 3.5–5.1)
Sodium: 137 mmol/L (ref 135–145)

## 2021-08-30 MED ORDER — ROSUVASTATIN CALCIUM 5 MG PO TABS
15.0000 mg | ORAL_TABLET | Freq: Every day | ORAL | Status: DC
  Administered 2021-08-31: 09:00:00 15 mg via ORAL
  Filled 2021-08-30: qty 3

## 2021-08-30 NOTE — Progress Notes (Signed)
Mobility Specialist Progress Note:   08/30/21 1600  Mobility  Activity Ambulated in hall  Level of Assistance Independent  Assistive Device None  Distance Ambulated (ft) 1650 ft  Mobility Ambulated independently in hallway  Mobility Response Tolerated well  Mobility performed by Mobility specialist  $Mobility charge 1 Mobility   Pt with slight abdominal discomfort during ambulation. Otherwise asx, pt back in bed with all needs met. Encouraged ambulation in hall later today.   Nelta Numbers Mobility Specialist  Phone [phone]

## 2021-08-30 NOTE — Progress Notes (Signed)
° °  Progress Note     Subjective: Abdomen is stable to improved - tolerating diet and having bowel movements. Pain controlled. Having some chills - not sure if related to room air conditioning. Now with cough - started about 2 days ago but worsening. Productive. No chest pain or shortness of breath. No nausea or emesis   Objective: Vital signs in last 24 hours: Temp:  [98.5 F (36.9 C)-100.4 F (38 C)] 99.2 F (37.3 C) (12/19 0456) Pulse Rate:  [74-92] 74 (12/19 0456) Resp:  [16-19] 16 (12/19 0100) BP: (101-125)/(60-74) 108/60 (12/19 0456) SpO2:  [95 %-98 %] 95 % (12/19 0456) Last BM Date: 08/29/21  Intake/Output from previous day: 12/18 0701 - 12/19 0700 In: 880 [P.O.:880] Out: -  Intake/Output this shift: No intake/output data recorded.  PE: General: pleasant, WD,male who is laying in bed in NAD HEENT: head is normocephalic, atraumatic. Mouth is pink and moist Heart: regular, rate, and rhythm. Palpable radial and pedal pulses bilaterally Lungs: CTAB, no wheezes, rhonchi, or rales noted.  Respiratory effort nonlabored on room air. Intermittent cough Abd: soft, ND, +BS, very mild TTP around incisions which are c/d/I with glue - no erythema or discharge MSK: all 4 extremities are symmetrical with no cyanosis, clubbing, or edema. Calves are soft and no TTP Skin: warm and dry  Psych: A&Ox3 with an appropriate affect.   Lab Results:  Recent Labs    08/29/21 0040 08/30/21 0103  WBC 8.4 9.4  HGB 7.7* 8.1*  HCT 21.6* 22.9*  PLT 245 276   BMET Recent Labs    08/29/21 0040 08/30/21 0103  NA 139 137  K 3.7 3.6  CL 108 103  CO2 24 25  GLUCOSE 122* 144*  BUN 12 8  CREATININE 1.09 0.98  CALCIUM 8.0* 8.2*   PT/INR Recent Labs    08/28/21 0441  LABPROT 14.7  INR 1.2   CMP     Component Value Date/Time   NA 137 08/30/2021 0103   K 3.6 08/30/2021 0103   CL 103 08/30/2021 0103   CO2 25 08/30/2021 0103   GLUCOSE 144 (H) 08/30/2021 0103   BUN 8 08/30/2021 0103    CREATININE 0.98 08/30/2021 0103   CALCIUM 8.2 (L) 08/30/2021 0103   PROT 6.7 08/27/2021 2105   ALBUMIN 3.9 08/27/2021 2105   AST 43 (H) 08/27/2021 2105   ALT 62 (H) 08/27/2021 2105   ALKPHOS 58 08/27/2021 2105   BILITOT 0.9 08/27/2021 2105   GFRNONAA >60 08/30/2021 0103   GFRAA >60 01/29/2020 1648   Lipase  No results found for: LIPASE     Studies/Results: No results found.  Anti-infectives: Anti-infectives (From admission, onward)    None        Assessment/Plan  S/p laparoscopic appendectomy 08/24/21 Dr. Rosendo Gros Readmitted 08/28/21 with hemoperitoneum - hemodynamically stable; Hgb has equilibrated and improved Constipation - Miralax. Has had BM Renal function improved after hydration Encouraged incentive spirometer  Cough - repeat COVID/flu pending, CXR this am  WBC normal and Tmax 100.48F 0100 this am and now with possible URI. Improving post op  FEN: regular ID: ceftriaxone/flagyl 12/13 VTE: SCDs  Dispo: await labs and cxr. Possible dc this pm vs tomorrow am   LOS: 2 days    Winferd Humphrey, [facility] 08/30/2021, 7:51 AM Please see Amion for pager number during day hours 7:00am-4:30pm

## 2021-08-31 ENCOUNTER — Encounter (HOSPITAL_COMMUNITY): Payer: Self-pay

## 2021-08-31 MED ORDER — BENZONATATE 100 MG PO CAPS: 100.0000 mg | ORAL_CAPSULE | Freq: Three times a day (TID) | ORAL | 0 refills | Status: AC | PRN

## 2021-08-31 MED ORDER — ACETAMINOPHEN 325 MG PO TABS: 650.0000 mg | ORAL_TABLET | Freq: Four times a day (QID) | ORAL | Status: AC | PRN

## 2021-08-31 NOTE — Discharge Summary (Signed)
[facility] Discharge Summary   Patient ID: Christopher Collins MRN: [id number] DOB/AGE: 12-26-1965 55 y.o.  Admit date: 08/27/2021 Discharge date: 08/31/2021  Admitting Diagnosis: Abdominal pain Acute blood loss anemia AKI S/p laparoscopic appendectomy  Discharge Diagnosis Abdominal pain Acute blood loss anemia AKI S/p laparoscopic appendectomy Viral URI  Consultants None  Imaging: DG CHEST PORT 1 VIEW  Result Date: 08/30/2021 CLINICAL DATA:  Cough EXAM: PORTABLE CHEST 1 VIEW COMPARISON:  Chest x-ray 08/27/2021 FINDINGS: Heart is enlarged. Mediastinum appears stable. Pulmonary vasculature is within normal limits. No focal consolidation identified. No significant pleural effusion or pneumothorax identified. IMPRESSION: Cardiomegaly with no acute process identified. Electronically Signed   By: Ofilia Neas M.D.   On: 08/30/2021 09:36    Procedures Dr. Rosendo Gros (08/24/21) - Laparoscopic Appendectomy  Hospital Course:  Christopher Collins is an 55 y.o. male whom is well known to our service - underwent laparoscopic appendectomy 08/24/21 for acute appendicitis without evident perforation by Dr. Rosendo Gros. He was discharged home following. Returned to ED 08/27/21 with vague lower quadrant pains. He underwent evaluation at [facility] and was found to have low grade temp and acute blood loss anemia. CT scan a/p 08/27/21 demonstrated small to moderate hemorrhage in RLQ/pelvis. Small amount of free air 3 days out from surgery. Patient was admitted for observation and IV fluid hydration. He had a low appetite but tolerated diet well without nausea or emesis. Abdominal remained stable to improved. Acute kidney injury resolved. Acute blood loss anemia improved and WBC remained normal. He developed a cough during admission and repeat COVID, flu A/B collected which were all negative. Chest x ray performed without acute findings. During admission respirations remained  unlabored with good oxygen saturation. Patient likely with viral upper respiratory infection. On date of discharge the patient was voiding well, tolerating diet, ambulating well, pain well controlled, vital signs stable, incisions c/d/i and felt stable for discharge home.   Patient will follow up in our office as previously scheduled in 1 week and knows to call with questions or concerns.    He will discharge with tesalon perles for cough and was instructed to follow up with PCP after discharge  PE: General: pleasant, WD,male who is laying in bed in NAD HEENT: head is normocephalic, atraumatic. Mouth is pink and moist Heart: regular, rate, and rhythm. Palpable radial and pedal pulses bilaterally Lungs: CTAB, no wheezes, rhonchi, or rales noted.  Respiratory effort nonlabored on room air. Intermittent cough Abd: soft, ND, +BS, very mild TTP around incisions which are c/d/I with glue - no erythema or discharge MSK: all 4 extremities are symmetrical with no cyanosis, clubbing, or edema. Calves are soft and no TTP Skin: warm and dry  Psych: A&Ox3 with an appropriate affect.    Allergies as of 08/31/2021       Reactions   Nuvigil [armodafinil] Other (See Comments)   Anxiety and tinnitis 2014 after taking just 2 tabs.   Smallpox Vaccine Other (See Comments)   Made patient sick with small pox symptoms   Doxycycline Other (See Comments)    Increases tinnitis        Medication List     TAKE these medications    acetaminophen 325 MG tablet Commonly known as: TYLENOL Take 2 tablets (650 mg total) by mouth every 6 (six) hours as needed for mild pain or moderate pain.   Alpha-Lipoic Acid 300 MG Tabs Take 300 mg by mouth at bedtime.   ALPRAZolam 0.25 MG tablet Commonly known  as: XANAX Take 0.125-0.25 mg by mouth at bedtime as needed for anxiety or sleep.   amLODipine 5 MG tablet Commonly known as: NORVASC Take 10 mg by mouth daily.   amLODipine 10 MG tablet Commonly known as:  NORVASC Take 10 mg by mouth at bedtime.   aspirin EC 81 MG tablet Take 1 tablet (81 mg total) by mouth daily.   benzonatate 100 MG capsule Commonly known as: Tessalon Perles Take 1 capsule (100 mg total) by mouth every 8 (eight) hours as needed for up to 7 days for cough (limit use during the day and use at night as needed to help with sleep sleep).   clotrimazole-betamethasone cream Commonly known as: Lotrisone Apply 1 application topically 2 (two) times daily.   GINKGO BILOBA PO Take 1 tablet by mouth every morning. "[id number]"   ibuprofen 200 MG tablet Commonly known as: ADVIL Take 400 mg by mouth every 4 (four) hours as needed (pain).   lamoTRIgine 150 MG tablet Commonly known as: LAMICTAL Take 150 mg by mouth every morning.   lisinopril 10 MG tablet Commonly known as: ZESTRIL Take 10 mg by mouth at bedtime.   MAGNESIUM PO Take 1 capsule by mouth at bedtime.   mirtazapine 15 MG tablet Commonly known as: REMERON Take 15 mg by mouth at bedtime. Take with a 45 mg tablet for a total nightly dose of 60 mg   mirtazapine 45 MG tablet Commonly known as: REMERON Take 45 mg by mouth at bedtime. Take with a 15 mg tablet for a total nightly dose of 60 mg   mometasone 50 MCG/ACT nasal spray Commonly known as: NASONEX Place 2 sprays into the nose daily as needed (seasonal allergies).   montelukast 10 MG tablet Commonly known as: SINGULAIR Take 1 tablet (10 mg total) by mouth daily. What changed: when to take this   nitroGLYCERIN 0.4 MG SL tablet Commonly known as: NITROSTAT Place 1 tablet (0.4 mg total) under the tongue every 5 (five) minutes as needed for chest pain.   ondansetron 4 MG tablet Commonly known as: Zofran Take 1 tablet (4 mg total) by mouth daily as needed for nausea or vomiting.   PreviDent 5000 Enamel Protect 1.1-5 % Gel Generic drug: Sod Fluoride-Potassium Nitrate Place 1 application onto teeth 2 (two) times daily.   rosuvastatin 10 MG tablet Commonly  known as: CRESTOR Take 10 mg by mouth every morning. Take with a 5 mg tablet for a total daily dose of 15 mg   rosuvastatin 5 MG tablet Commonly known as: CRESTOR Take 5 mg by mouth every morning. Take with a 10 mg tablet for a total daily dose of 15 mg   Systane 0.4-0.3 % Soln Generic drug: Polyethyl Glycol-Propyl Glycol Place 1 drop into both eyes every 2 (two) hours.   traMADol 50 MG tablet Commonly known as: Ultram Take 1 tablet (50 mg total) by mouth every 6 (six) hours as needed.   Vitamin D-3 125 MCG (5000 UT) Tabs Take 5,000 Units by mouth every other day.   ZINC PO Take 1 capsule by mouth at bedtime.          Follow-up [facility] Follow up.   Specialty: General Surgery Why: please follow up as previously scheduled and call with any questions or concerns Contact information: [place]        Glenis Smoker, MD. Call.   Specialty: Family  Medicine Why: call to follow up with PCP in approximately one week from discharge for cough Contact information: [place] [id number] [phone]                 Signed: Caroll Rancher [facility] 08/31/2021, 11:00 AM Please see Amion for pager number during day hours 7:00am-4:30pm

## 2021-08-31 NOTE — Progress Notes (Signed)
Discharge instructions given to patient. Patient verbalizes understanding. Ivs removed, volunteer called, patient discharged

## 2021-08-31 NOTE — Social Work (Signed)
°  Transition of Care [facility]) Screening Note   Patient Details  Name: Christopher Collins Date of Birth: [birth year]   Transition [facility]) CM/SW Contact:    Emeterio Reeve, LCSW Phone Number: 08/31/2021, 11:02 AM    Transition of Care Department [facility]) has reviewed patient and no TOC needs have been identified at this time. We will continue to monitor patient advancement through interdisciplinary progression rounds. If new patient transition needs arise, please place a TOC consult.

## 2021-08-31 NOTE — Discharge Instructions (Signed)
[facility] ______[facility] LAPAROSCOPIC SURGERY: POST OP INSTRUCTIONS Always review your discharge instruction sheet given to you by the facility where your surgery was performed. IF YOU HAVE DISABILITY OR FAMILY LEAVE FORMS, YOU MUST BRING THEM TO THE OFFICE FOR PROCESSING.   DO NOT GIVE THEM TO YOUR DOCTOR.  A prescription for pain medication may be given to you upon discharge.  Take your pain medication as prescribed, if needed.  If narcotic pain medicine is not needed, then you may take acetaminophen (Tylenol) or ibuprofen (Advil) as needed. Take your usually prescribed medications unless otherwise directed. If you need a refill on your pain medication, please contact your pharmacy.  They will contact our office to request authorization. Prescriptions will not be filled after 5pm or on week-ends. You should follow a light diet the first few days after arrival home, such as soup and crackers, etc.  Be sure to include lots of fluids daily. Most patients will experience some swelling and bruising in the area of the incisions.  Ice packs will help.  Swelling and bruising can take several days to resolve.  It is common to experience some constipation if taking pain medication after surgery.  Increasing fluid intake and taking a stool softener (such as Colace) will usually help or prevent this problem from occurring.  A mild laxative (Milk of Magnesia or Miralax) should be taken according to package instructions if there are no bowel movements after 48 hours. Unless discharge instructions indicate otherwise, you may remove your bandages 24-48 hours after surgery, and you may shower at that time.  You may have steri-strips (small skin tapes) in place directly over the incision.  These strips should be left on the skin for 7-10 days.  If your surgeon used skin glue on the incision, you may shower in 24 hours.  The glue will flake off over the next 2-3 weeks.  Any sutures or staples will be  removed at the office during your follow-up visit. ACTIVITIES:  You may resume regular (light) daily activities beginning the next day--such as daily self-care, walking, climbing stairs--gradually increasing activities as tolerated.  You may have sexual intercourse when it is comfortable.  Refrain from any heavy lifting or straining until approved by your doctor - 3-4 weeks from surgery You may drive when you are no longer taking prescription pain medication, you can comfortably wear a seatbelt, and you can safely maneuver your car and apply brakes You should see your doctor in the office for a follow-up appointment approximately 2-3 weeks after your surgery.  Make sure that you call for this appointment within a day or two after you arrive home to insure a convenient appointment time. OTHER INSTRUCTIONS: __________________________________________________________________________________________________________________________ __________________________________________________________________________________________________________________________ WHEN TO CALL YOUR DOCTOR: Fever over 101.0 Inability to urinate Continued bleeding from incision. Increased pain, redness, or drainage from the incision. Increasing abdominal pain  The clinic staff is available to answer your questions during regular business hours.  Please dont hesitate to call and ask to speak to one of the nurses for clinical concerns.  If you have a medical emergency, go to the nearest emergency room or call 911.  A surgeon from [facility] is always on call at the hospital. [address] [place], [place], [place]  [address] ? [address], [place], [place]   [address] [phone] ? [phone] ? FAX [phone] Web site: [email or web address]      Managing Your Pain After Surgery Without Opioids    Thank  you for participating in our program to help patients manage their pain after surgery  without opioids. This is part of our effort to provide you with the best care possible, without exposing you or your family to the risk that opioids pose.  What pain can I expect after surgery? You can expect to have some pain after surgery. This is normal. The pain is typically worse the day after surgery, and quickly begins to get better. Many studies have found that many patients are able to manage their pain after surgery with Over-the-Counter (OTC) medications such as Tylenol and Motrin. If you have a condition that does not allow you to take Tylenol or Motrin, notify your surgical team.  How will I manage my pain? The best strategy for controlling your pain after surgery is around the clock pain control with Tylenol (acetaminophen) and Motrin (ibuprofen or Advil). Alternating these medications with each other allows you to maximize your pain control. In addition to Tylenol and Motrin, you can use heating pads or ice packs on your incisions to help reduce your pain.  How will I alternate your regular strength over-the-counter pain medication? You will take a dose of pain medication every three hours. Start by taking 650 mg of Tylenol (2 pills of 325 mg) 3 hours later take 600 mg of Motrin (3 pills of 200 mg) 3 hours after taking the Motrin take 650 mg of Tylenol 3 hours after that take 600 mg of Motrin.   - 1 -  See example - if your first dose of Tylenol is at 12:00 PM   12:00 PM Tylenol 650 mg (2 pills of 325 mg)  3:00 PM Motrin 600 mg (3 pills of 200 mg)  6:00 PM Tylenol 650 mg (2 pills of 325 mg)  9:00 PM Motrin 600 mg (3 pills of 200 mg)  Continue alternating every 3 hours   We recommend that you follow this schedule around-the-clock for at least 3 days after surgery, or until you feel that it is no longer needed. Use the table on the last page of this handout to keep track of the medications you are taking. Important: Do not take more than 3000mg  of Tylenol or 3200mg  of  Motrin in a 24-hour period. Do not take ibuprofen/Motrin if you have a history of bleeding stomach ulcers, severe kidney disease, &/or actively taking a blood thinner  What if I still have pain? If you have pain that is not controlled with the over-the-counter pain medications (Tylenol and Motrin or Advil) you might have what we call breakthrough pain. You will receive a prescription for a small amount of an opioid pain medication such as Oxycodone, Tramadol, or Tylenol with Codeine. Use these opioid pills in the first 24 hours after surgery if you have breakthrough pain. Do not take more than 1 pill every 4-6 hours.  If you still have uncontrolled pain after using all opioid pills, don't hesitate to call our staff using the number provided. We will help make sure you are managing your pain in the best way possible, and if necessary, we can provide a prescription for additional pain medication.   Day 1    Time  Name of Medication Number of pills taken  Amount of Acetaminophen  Pain Level   Comments  AM PM       AM PM       AM PM       AM PM       AM PM  AM PM       AM PM       AM PM       Total Daily amount of Acetaminophen Do not take more than  3,000 mg per day      Day 2    Time  Name of Medication Number of pills taken  Amount of Acetaminophen  Pain Level   Comments  AM PM       AM PM       AM PM       AM PM       AM PM       AM PM       AM PM       AM PM       Total Daily amount of Acetaminophen Do not take more than  3,000 mg per day      Day 3    Time  Name of Medication Number of pills taken  Amount of Acetaminophen  Pain Level   Comments  AM PM       AM PM       AM PM       AM PM          AM PM       AM PM       AM PM       AM PM       Total Daily amount of Acetaminophen Do not take more than  3,000 mg per day      Day 4    Time  Name of Medication Number of pills taken  Amount of Acetaminophen  Pain Level   Comments  AM  PM       AM PM       AM PM       AM PM       AM PM       AM PM       AM PM       AM PM       Total Daily amount of Acetaminophen Do not take more than  3,000 mg per day      Day 5    Time  Name of Medication Number of pills taken  Amount of Acetaminophen  Pain Level   Comments  AM PM       AM PM       AM PM       AM PM       AM PM       AM PM       AM PM       AM PM       Total Daily amount of Acetaminophen Do not take more than  3,000 mg per day       Day 6    Time  Name of Medication Number of pills taken  Amount of Acetaminophen  Pain Level  Comments  AM PM       AM PM       AM PM       AM PM       AM PM       AM PM       AM PM       AM PM       Total Daily amount of Acetaminophen Do not take more than  3,000 mg per day      Day 7    Time  Name of Medication Number of pills taken  Amount of Acetaminophen  Pain Level   Comments  AM PM       AM PM       AM PM       AM PM       AM PM       AM PM       AM PM       AM PM       Total Daily amount of Acetaminophen Do not take more than  3,000 mg per day        For additional information about how and where to safely dispose of unused opioid medications - [employer]  Disclaimer: This document contains information and/or instructional materials adapted from [employer] for the typical patient with your condition. It does not replace medical advice from your health care provider because your experience may differ from that of the typical patient. Talk to your health care provider if you have any questions about this document, your condition or your treatment plan. Adapted from [employer]

## 2021-08-31 NOTE — Progress Notes (Incomplete)
Progress Note     Subjective: Abdomen is stable to improved - tolerating diet and having bowel movements. Pain controlled. Having some chills - not sure if related to room air conditioning. Now with cough - started about 2 days ago but worsening. Productive. No chest pain or shortness of breath. No nausea or emesis   Objective: Vital signs in last 24 hours: Temp:  [99.5 F (37.5 C)-100.8 F (38.2 C)] 100.6 F (38.1 C) (12/20 0426) Pulse Rate:  [80-86] 81 (12/20 0426) Resp:  [18-20] 20 (12/20 0426) BP: (115-125)/(66-73) 115/73 (12/20 0426) SpO2:  [93 %-99 %] 93 % (12/20 0426) Last BM Date: 08/29/21  Intake/Output from previous day: No intake/output data recorded. Intake/Output this shift: No intake/output data recorded.  PE: General: pleasant, WD,male who is laying in bed in NAD HEENT: head is normocephalic, atraumatic. Mouth is pink and moist Heart: regular, rate, and rhythm. Palpable radial and pedal pulses bilaterally Lungs: CTAB, no wheezes, rhonchi, or rales noted.  Respiratory effort nonlabored on room air. Intermittent cough Abd: soft, ND, +BS, very mild TTP around incisions which are c/d/I with glue - no erythema or discharge MSK: all 4 extremities are symmetrical with no cyanosis, clubbing, or edema. Calves are soft and no TTP Skin: warm and dry  Psych: A&Ox3 with an appropriate affect.   Lab Results:  Recent Labs    08/29/21 0040 08/30/21 0103  WBC 8.4 9.4  HGB 7.7* 8.1*  HCT 21.6* 22.9*  PLT 245 276    BMET Recent Labs    08/29/21 0040 08/30/21 0103  NA 139 137  K 3.7 3.6  CL 108 103  CO2 24 25  GLUCOSE 122* 144*  BUN 12 8  CREATININE 1.09 0.98  CALCIUM 8.0* 8.2*    PT/INR No results for input(s): LABPROT, INR in the last 72 hours.  CMP     Component Value Date/Time   NA 137 08/30/2021 0103   K 3.6 08/30/2021 0103   CL 103 08/30/2021 0103   CO2 25 08/30/2021 0103   GLUCOSE 144 (H) 08/30/2021 0103   BUN 8 08/30/2021 0103    CREATININE 0.98 08/30/2021 0103   CALCIUM 8.2 (L) 08/30/2021 0103   PROT 6.7 08/27/2021 2105   ALBUMIN 3.9 08/27/2021 2105   AST 43 (H) 08/27/2021 2105   ALT 62 (H) 08/27/2021 2105   ALKPHOS 58 08/27/2021 2105   BILITOT 0.9 08/27/2021 2105   GFRNONAA >60 08/30/2021 0103   GFRAA >60 01/29/2020 1648   Lipase  No results found for: LIPASE     Studies/Results: DG CHEST PORT 1 VIEW  Result Date: 08/30/2021 CLINICAL DATA:  Cough EXAM: PORTABLE CHEST 1 VIEW COMPARISON:  Chest x-ray 08/27/2021 FINDINGS: Heart is enlarged. Mediastinum appears stable. Pulmonary vasculature is within normal limits. No focal consolidation identified. No significant pleural effusion or pneumothorax identified. IMPRESSION: Cardiomegaly with no acute process identified. Electronically Signed   By: Ofilia Neas M.D.   On: 08/30/2021 09:36    Anti-infectives: Anti-infectives (From admission, onward)    None        Assessment/Plan  S/p laparoscopic appendectomy 08/24/21 Dr. Rosendo Gros Readmitted 08/28/21 with hemoperitoneum - hemodynamically stable; Hgb has equilibrated and improved Constipation - Miralax. Has had BM Renal function improved after hydration Encouraged incentive spirometer  Cough - repeat COVID/flu pending, CXR this am  WBC normal and Tmax 100.54F 0100 this am and now with possible URI. Improving post op  FEN: regular ID: ceftriaxone/flagyl 12/13 VTE: SCDs  Dispo: await labs and  cxr. Possible dc this pm vs tomorrow am   LOS: 3 days    Winferd Humphrey, [facility] 08/31/2021, 7:52 AM Please see Am[facility] for pager number during day hours 7:00am-4:30pm

## 2021-09-03 ENCOUNTER — Other Ambulatory Visit: Payer: BC Managed Care – PPO

## 2021-09-08 ENCOUNTER — Other Ambulatory Visit: Payer: Self-pay

## 2021-09-08 ENCOUNTER — Ambulatory Visit (HOSPITAL_BASED_OUTPATIENT_CLINIC_OR_DEPARTMENT_OTHER)
Admission: RE | Admit: 2021-09-08 | Discharge: 2021-09-08 | Disposition: A | Discharge status: Home / Self Care | Payer: BC Managed Care – PPO | Source: Ambulatory Visit | Attending: Student | Admitting: Student

## 2021-09-08 ENCOUNTER — Other Ambulatory Visit: Payer: Self-pay | Admitting: Radiology

## 2021-09-08 ENCOUNTER — Encounter (HOSPITAL_BASED_OUTPATIENT_CLINIC_OR_DEPARTMENT_OTHER): Payer: Self-pay

## 2021-09-08 ENCOUNTER — Other Ambulatory Visit (HOSPITAL_BASED_OUTPATIENT_CLINIC_OR_DEPARTMENT_OTHER): Payer: Self-pay | Admitting: Student

## 2021-09-08 ENCOUNTER — Other Ambulatory Visit: Payer: Self-pay | Admitting: Student

## 2021-09-08 ENCOUNTER — Other Ambulatory Visit (HOSPITAL_COMMUNITY): Payer: Self-pay | Admitting: Student

## 2021-09-08 DIAGNOSIS — R509 Fever, unspecified: Secondary | ICD-10-CM

## 2021-09-08 DIAGNOSIS — T8143XA Infection following a procedure, organ and space surgical site, initial encounter: Secondary | ICD-10-CM

## 2021-09-08 MED ORDER — IOHEXOL 300 MG/ML  SOLN
100.0000 mL | Freq: Once | INTRAMUSCULAR | Status: AC | PRN
  Administered 2021-09-08: 12:00:00 100 mL via INTRAVENOUS

## 2021-09-08 NOTE — Progress Notes (Signed)
Patient ID: Christopher Collins, male   DOB: 11/23/1965, 55 y.o.   MRN: [id number] Received call from [place] today to schedule pt for RLQ post op abscess drain placement as an OP this week. Images were reviewed by Dr. Maryelizabeth Kaufmann and abscess appears amenable to drain placement. Our schedulers will contact pt and arrange time for case, likely on 12/29 at [facility].

## 2021-09-09 ENCOUNTER — Other Ambulatory Visit (HOSPITAL_COMMUNITY): Payer: Self-pay

## 2021-09-09 ENCOUNTER — Encounter (HOSPITAL_COMMUNITY): Payer: Self-pay

## 2021-09-09 ENCOUNTER — Other Ambulatory Visit: Payer: Self-pay | Admitting: Student

## 2021-09-09 ENCOUNTER — Ambulatory Visit (HOSPITAL_COMMUNITY)
Admission: RE | Admit: 2021-09-09 | Discharge: 2021-09-09 | Disposition: A | Discharge status: Home / Self Care | Payer: BC Managed Care – PPO | Source: Ambulatory Visit | Attending: Radiology | Admitting: Radiology

## 2021-09-09 VITALS — BP 108/72 | HR 79 | Temp 99.0°F | Resp 15 | Ht 73.5 in | Wt 220.0 lb

## 2021-09-09 DIAGNOSIS — T8143XA Infection following a procedure, organ and space surgical site, initial encounter: Secondary | ICD-10-CM | POA: Insufficient documentation

## 2021-09-09 DIAGNOSIS — R109 Unspecified abdominal pain: Secondary | ICD-10-CM | POA: Insufficient documentation

## 2021-09-09 DIAGNOSIS — Y838 Other surgical procedures as the cause of abnormal reaction of the patient, or of later complication, without mention of misadventure at the time of the procedure: Secondary | ICD-10-CM | POA: Insufficient documentation

## 2021-09-09 DIAGNOSIS — Z9049 Acquired absence of other specified parts of digestive tract: Secondary | ICD-10-CM

## 2021-09-09 DIAGNOSIS — F319 Bipolar disorder, unspecified: Secondary | ICD-10-CM | POA: Diagnosis not present

## 2021-09-09 DIAGNOSIS — M1990 Unspecified osteoarthritis, unspecified site: Secondary | ICD-10-CM | POA: Diagnosis not present

## 2021-09-09 DIAGNOSIS — I10 Essential (primary) hypertension: Secondary | ICD-10-CM | POA: Insufficient documentation

## 2021-09-09 LAB — CBC WITH DIFFERENTIAL/PLATELET
Abs Immature Granulocytes: 0.05 10*3/uL (ref 0.00–0.07)
Basophils Absolute: 0.1 10*3/uL (ref 0.0–0.1)
Basophils Relative: 1 %
Eosinophils Absolute: 0.2 10*3/uL (ref 0.0–0.5)
Eosinophils Relative: 2 %
HCT: 33.8 % — ABNORMAL LOW (ref 39.0–52.0)
Hemoglobin: 11.3 g/dL — ABNORMAL LOW (ref 13.0–17.0)
Immature Granulocytes: 1 %
Lymphocytes Relative: 15 %
Lymphs Abs: 1.5 10*3/uL (ref 0.7–4.0)
MCH: 30.8 pg (ref 26.0–34.0)
MCHC: 33.4 g/dL (ref 30.0–36.0)
MCV: 92.1 fL (ref 80.0–100.0)
Monocytes Absolute: 1 10*3/uL (ref 0.1–1.0)
Monocytes Relative: 10 %
Neutro Abs: 7.1 10*3/uL (ref 1.7–7.7)
Neutrophils Relative %: 71 %
Platelets: 689 10*3/uL — ABNORMAL HIGH (ref 150–400)
RBC: 3.67 MIL/uL — ABNORMAL LOW (ref 4.22–5.81)
RDW: 12.5 % (ref 11.5–15.5)
WBC: 9.8 10*3/uL (ref 4.0–10.5)
nRBC: 0 % (ref 0.0–0.2)

## 2021-09-09 LAB — PROTIME-INR
INR: 1.1 (ref 0.8–1.2)
Prothrombin Time: 14.4 seconds (ref 11.4–15.2)

## 2021-09-09 MED ORDER — FENTANYL CITRATE (PF) 100 MCG/2ML IJ SOLN
INTRAMUSCULAR | Status: AC
  Filled 2021-09-09: qty 4

## 2021-09-09 MED ORDER — LIDOCAINE HCL 1 % IJ SOLN
INTRAMUSCULAR | Status: AC
  Filled 2021-09-09: qty 10

## 2021-09-09 MED ORDER — FENTANYL CITRATE (PF) 100 MCG/2ML IJ SOLN
INTRAMUSCULAR | Status: AC | PRN
  Administered 2021-09-09 (×2): 25 ug via INTRAVENOUS
  Administered 2021-09-09: 50 ug via INTRAVENOUS

## 2021-09-09 MED ORDER — NORMAL SALINE FLUSH 0.9 % IV SOLN
10.0000 mL | INTRAVENOUS | 0 refills | Status: AC | PRN
  Filled 2021-09-09: qty 300, 8d supply, fill #0

## 2021-09-09 MED ORDER — SODIUM CHLORIDE 0.9% FLUSH: 10.0000 mL | Freq: Two times a day (BID) | INTRAVENOUS | Status: DC

## 2021-09-09 MED ORDER — MIDAZOLAM HCL 2 MG/2ML IJ SOLN
INTRAMUSCULAR | Status: AC
  Filled 2021-09-09: qty 4

## 2021-09-09 MED ORDER — CEFAZOLIN SODIUM-DEXTROSE 2-4 GM/100ML-% IV SOLN
INTRAVENOUS | Status: AC
  Administered 2021-09-09: 11:00:00 2 g via INTRAVENOUS
  Filled 2021-09-09: qty 100

## 2021-09-09 MED ORDER — CEFAZOLIN SODIUM-DEXTROSE 2-4 GM/100ML-% IV SOLN
2.0000 g | Freq: Once | INTRAVENOUS | Status: AC
  Filled 2021-09-09: qty 100

## 2021-09-09 MED ORDER — SODIUM CHLORIDE 0.9 % IV SOLN: INTRAVENOUS | Status: DC

## 2021-09-09 MED ORDER — MIDAZOLAM HCL 2 MG/2ML IJ SOLN
INTRAMUSCULAR | Status: AC | PRN
  Administered 2021-09-09: .5 mg via INTRAVENOUS
  Administered 2021-09-09: 1 mg via INTRAVENOUS
  Administered 2021-09-09: .5 mg via INTRAVENOUS

## 2021-09-09 NOTE — H&P (Signed)
Chief Complaint: Patient was seen in consultation today for intra-abdominal fluid collection  Referring Physician(s): Dr. Nadeen Landau  Supervising Physician: Michaelle Birks  Patient Status: Christopher Collins - Out-pt  History of Present Illness: Christopher Collins is a 55 y.o. male with past medical history of arthritis, mild bipolar disorder, depression,and HTN who recently underwent lap appendectomy due to acute appendicitis without rupture.  Patient was initially recovering well at home, however develop abdominal pain and fever.  He was seen at Urgent Care 12/16 where imaging showed bowel thickening and small/moderate hemorrhage throughout the abdomen which did not require immediate surgical re-exploration.  Follow-up imaging 12/28 showed large 10 x 5.8 x 21 cm complex fluid collection in the right side of the abdomen extending from the inferior margin of the liver to the pelvis most consistent with an abscess.  He was referred to IR for aspiration and drainage.   Case reviewed and approved by Dr. Maryelizabeth Kaufmann.    Patient presents for procedure today.  He confirms the history as outlined above and reports ongoing abdominal pain. He has been taking Augmentin and has 2 more doses. He does have family/friend support and is planning to stay with a friend overnight for recovery.  He has been NPO.   Past Medical History:  Diagnosis Date   Arthritis    hands   Bipolar disorder ([place])    states mild   Degenerative joint disease of hand 03/2013   right   Dental crown present    also dental cap - lower   Depression    Hypersomnia with sleep apnea    Hypertension    under control with meds., has been on med. x 2 yr.   Mucoid cyst of joint 03/2013   right middle finger   Sleep apnea with use of continuous positive airway pressure (CPAP)     Past Surgical History:  Procedure Laterality Date   CHOLECYSTECTOMY  2000s   FINGER SURGERY Right 03/2013   right middle finger-cyst removed    LAPAROSCOPIC APPENDECTOMY N/A 08/24/2021   Procedure: APPENDECTOMY LAPAROSCOPIC;  Surgeon: Ralene Ok, MD;  Location: [facility];  Service: General;  Laterality: N/A;   TONSILLECTOMY AND ADENOIDECTOMY     as a child    Allergies: Nuvigil [armodafinil], Smallpox vaccine, and Doxycycline  Medications: Prior to Admission medications   Medication Sig Start Date End Date Taking? Authorizing Provider  acetaminophen (TYLENOL) 325 MG tablet Take 2 tablets (650 mg total) by mouth every 6 (six) hours as needed for mild pain or moderate pain. 08/31/21  Yes Winferd Humphrey, PA-C  Alpha-Lipoic Acid 300 MG TABS Take 300 mg by mouth at bedtime.   Yes [provider]  ALPRAZolam (XANAX) 0.25 MG tablet Take 0.125-0.25 mg by mouth at bedtime as needed for anxiety or sleep. 02/26/20  Yes [provider]  amLODipine (NORVASC) 10 MG tablet Take 10 mg by mouth at bedtime. 08/11/21  Yes [provider]  aspirin EC 81 MG tablet Take 1 tablet (81 mg total) by mouth daily. 02/13/20  Yes Minus Breeding, MD  Cholecalciferol (VITAMIN D-3) 125 MCG (5000 UT) TABS Take 5,000 Units by mouth every other day.   Yes [provider]  GINKGO BILOBA PO Take 1 tablet by mouth every morning. "[id number]"   Yes [provider]  ibuprofen (ADVIL) 200 MG tablet Take 400 mg by mouth every 4 (four) hours as needed (pain).   Yes [provider]  lamoTRIgine (LAMICTAL) 150 MG tablet Take 150 mg  by mouth every morning. 03/20/21  Yes [provider]  lisinopril (PRINIVIL,ZESTRIL) 10 MG tablet Take 10 mg by mouth at bedtime.   Yes [provider]  MAGNESIUM PO Take 1 capsule by mouth at bedtime.   Yes [provider]  mirtazapine (REMERON) 15 MG tablet Take 15 mg by mouth at bedtime. Take with a 45 mg tablet for a total nightly dose of 60 mg 03/13/21  Yes [provider]  mirtazapine (REMERON) 45 MG tablet Take 45 mg by mouth at bedtime. Take with a 15 mg  tablet for a total nightly dose of 60 mg   Yes [provider]  montelukast (SINGULAIR) 10 MG tablet Take 1 tablet (10 mg total) by mouth daily. Patient taking differently: Take 10 mg by mouth at bedtime. 08/10/15  Yes Kozlow, Donnamarie Poag, MD  Multiple Vitamins-Minerals (ZINC PO) Take 1 capsule by mouth at bedtime.   Yes [provider]  ondansetron (ZOFRAN) 4 MG tablet Take 1 tablet (4 mg total) by mouth daily as needed for nausea or vomiting. 08/24/21 08/24/22 Yes Ralene Ok, MD  PREVIDENT 5000 ENAMEL PROTECT 1.1-5 % GEL Place 1 application onto teeth 2 (two) times daily. 11/12/20  Yes [provider]  rosuvastatin (CRESTOR) 10 MG tablet Take 10 mg by mouth every morning. Take with a 5 mg tablet for a total daily dose of 15 mg   Yes [provider]  rosuvastatin (CRESTOR) 5 MG tablet Take 5 mg by mouth every morning. Take with a 10 mg tablet for a total daily dose of 15 mg 08/25/21  Yes [provider]  SYSTANE 0.4-0.3 % SOLN Place 1 drop into both eyes every 2 (two) hours.   Yes [provider]  mometasone (NASONEX) 50 MCG/ACT nasal spray Place 2 sprays into the nose daily as needed (seasonal allergies). 04/07/14   [provider]  nitroGLYCERIN (NITROSTAT) 0.4 MG SL tablet Place 1 tablet (0.4 mg total) under the tongue every 5 (five) minutes as needed for chest pain. 02/13/20 08/28/21  Minus Breeding, MD     Family History  Problem Relation Age of Onset   Lung cancer Father    AAA (abdominal aortic aneurysm) Father    Heart attack Paternal Grandfather 70   AAA (abdominal aortic aneurysm) Paternal 16        Died from this age 15    Social History   Socioeconomic History   Marital status: Single    Spouse name: Not on file   Number of children: 0   Years of education: PHD   Highest education level: Not on file  Occupational History    Employer: [employer]    Comment: non shift worker  Tobacco Use   Smoking  status: Never    Passive exposure: Past   Smokeless tobacco: Never  Vaping Use   Vaping Use: Never used  Substance and Sexual Activity   Alcohol use: Not Currently    Comment: None for the last year   Drug use: No   Sexual activity: Not Currently  Other Topics Concern   Not on file  Social History Narrative   [place] on 8 cm CPAP is down to 8 from 11 cm, but nasal pillow doesn't fit as well as expected. Has used CPAP four or more hours nightly, 100% compliance.   His download  showed an AHI of 5.9 and mostly consists of  central apneas.  This indicated too high of a pressure. Suggested reduction to 7  cm water and 3 cm flex and a pilaire mask to avoid dislodgement and an  air leak. [facility] is DME.   BMI addressed with PCP.  Visit 30 minutes.    Lives alone.   Advertising account planner.         Social Determinants of Health   Financial Resource Strain: Not on file  Food Insecurity: Not on file  Transportation Needs: Not on file  Physical Activity: Not on file  Stress: Not on file  Social Connections: Not on file     Review of Systems: A 12 point ROS discussed and pertinent positives are indicated in the HPI above.  All other systems are negative.  Review of Systems  Constitutional:  Negative for fatigue and fever.  Respiratory:  Negative for cough and shortness of breath.   Cardiovascular:  Negative for chest pain.  Gastrointestinal:  Positive for abdominal pain and constipation. Negative for abdominal distention.  Musculoskeletal:  Negative for back pain.  Psychiatric/Behavioral:  Negative for behavioral problems and confusion.    Vital Signs: BP 127/74    Pulse 90    Temp 99 F (37.2 C) (Oral)    Resp 18    Ht 6' 1.5" (1.867 m)    Wt 220 lb (99.8 kg)    SpO2 99%    BMI 28.63 kg/m   Physical Exam Vitals and nursing note reviewed.  Constitutional:      General: He is not in acute distress.    Appearance: Normal appearance. He is not ill-appearing.  HENT:     Mouth/Throat:      Mouth: Mucous membranes are moist.     Pharynx: Oropharynx is clear.  Cardiovascular:     Rate and Rhythm: Normal rate and regular rhythm.  Pulmonary:     Effort: Pulmonary effort is normal. No respiratory distress.     Breath sounds: Normal breath sounds.  Abdominal:     General: Abdomen is flat.     Palpations: Abdomen is soft.     Tenderness: There is abdominal tenderness.  Skin:    General: Skin is warm and dry.  Neurological:     General: No focal deficit present.     Mental Status: He is alert and oriented to person, place, and time. Mental status is at baseline.  Psychiatric:        Mood and Affect: Mood normal.        Behavior: Behavior normal.        Thought Content: Thought content normal.        Judgment: Judgment normal.     MD Evaluation Airway: WNL Heart: WNL Abdomen: WNL Chest/ Lungs: WNL ASA  Classification: 3 Mallampati/Airway Score: Two   Imaging: CT ABDOMEN PELVIS WO CONTRAST  Result Date: 08/27/2021 CLINICAL DATA:  Status post appendectomy.  Abdominal pain and fever. EXAM: CT ABDOMEN AND PELVIS WITHOUT CONTRAST TECHNIQUE: Multidetector CT imaging of the abdomen and pelvis was performed following the standard protocol without IV contrast. COMPARISON:  CT abdomen and pelvis 08/24/2021 FINDINGS: Lower chest: There are atelectatic changes in both lung bases. There is a trace right pleural effusion. Hepatobiliary: There are rounded hypodensities in the liver which are too small to characterize and unchanged. These are favored as cysts or hemangiomas. The gallbladder surgically absent. No biliary ductal dilatation., Likely a cyst. Pancreas: Unremarkable. No pancreatic ductal dilatation or surrounding inflammatory changes. Spleen: Normal in size without focal abnormality. Adrenals/Urinary Tract: Adrenal glands are unremarkable. Kidneys are normal, without renal calculi, focal lesion, or  hydronephrosis. Bladder is unremarkable. Stomach/Bowel: Patient is status post  cholecystectomy. There is some wall thickening of the cecum. There is no evidence for bowel obstruction. Small bowel and stomach appear within normal limits. There is sigmoid colon diverticulosis without evidence for acute diverticulitis. Vascular/Lymphatic: Aortic atherosclerosis. No enlarged abdominal or pelvic lymph nodes. Reproductive: Prostate is unremarkable. Other: There is a small to moderate amount of hyperdense free fluid in the pelvis, right paracolic gutter and left upper quadrant. Findings are most compatible with hemorrhage. Source of hemorrhage is unclear. There is a small amount of free air in the right upper quadrant compatible with recent surgery. There is no focal abdominal wall hernia. There is some stranding and air surrounding the umbilicus likely related to recent surgery. No fluid collections are identified. Musculoskeletal: Mild chronic compression deformity of L1. No acute fractures are seen. IMPRESSION: 1. Status post cholecystectomy. 2. Wall thickening of the cecum which may be reactive colitis secondary to recent surgery. No fluid collections are seen. 3. Small to moderate amount of hemorrhage throughout the abdomen and pelvis of uncertain etiology. 4. Small amount of free air and mild inflammation and air at the level of the umbilicus is compatible with recent surgery. 5. Bibasilar atelectasis with small right pleural effusion. Electronically Signed   By: Ronney Asters M.D.   On: 08/27/2021 22:40   CT ABDOMEN PELVIS W CONTRAST  Result Date: 09/08/2021 CLINICAL DATA:  Status post appendectomy.  Pain since surgery. EXAM: CT ABDOMEN AND PELVIS WITH CONTRAST TECHNIQUE: Multidetector CT imaging of the abdomen and pelvis was performed using the standard protocol following bolus administration of intravenous contrast. CONTRAST:  135mL OMNIPAQUE IOHEXOL 300 MG/ML  SOLN COMPARISON:  08/27/2021 FINDINGS: Lower chest: Right basilar atelectasis. Hepatobiliary: No focal liver abnormality is  seen. Status post cholecystectomy. No biliary dilatation. Pancreas: Unremarkable. No pancreatic ductal dilatation or surrounding inflammatory changes. Spleen: Normal in size without focal abnormality. Adrenals/Urinary Tract: Adrenal glands are unremarkable. Kidneys are normal, without renal calculi, focal lesion, or hydronephrosis. Bladder is unremarkable. Stomach/Bowel: Prior appendectomy. Normal stomach. No bowel dilatation to suggest obstruction. Diverticulosis without evidence of diverticulitis. Vascular/Lymphatic: Aortic atherosclerosis. No enlarged abdominal or pelvic lymph nodes. Reproductive: Prostate is unremarkable. Other: 10 x 5.8 x 21 cm complex fluid collection with peripheral enhancement along the right side of the abdomen extending from the inferior margin of the liver to the pelvis. Musculoskeletal: No acute osseous abnormality. No aggressive osseous lesion. IMPRESSION: 1. Status post appendectomy. Large 10 x 5.8 x 21 cm complex fluid collection in the right side of the abdomen extending from the inferior margin of the liver to the pelvis most consistent with an abscess. Electronically Signed   By: Kathreen Devoid M.D.   On: 09/08/2021 12:08   CT ABDOMEN PELVIS W CONTRAST  Result Date: 08/24/2021 CLINICAL DATA:  Right lower quadrant pain for 3 days, intermittent fever EXAM: CT ABDOMEN AND PELVIS WITH CONTRAST TECHNIQUE: Multidetector CT imaging of the abdomen and pelvis was performed using the standard protocol following bolus administration of intravenous contrast. CONTRAST:  162mL OMNIPAQUE IOHEXOL 300 MG/ML  SOLN COMPARISON:  None. FINDINGS: Lower chest: No acute abnormality. Hepatobiliary: Scattered hepatic hypodensities compatible with small hepatic cysts. No other significant focal hepatic abnormality. No biliary dilatation or obstruction. Hepatic and portal veins are patent. Remote cholecystectomy. Pancreas: Unremarkable. No pancreatic ductal dilatation or surrounding inflammatory changes.  Spleen: Normal in size without focal abnormality. Adrenals/Urinary Tract: Adrenal glands are unremarkable. Kidneys are normal, without renal calculi, focal lesion, or hydronephrosis.  Bladder is unremarkable. Stomach/Bowel: Negative for bowel obstruction, significant dilatation, ileus, or free air. Appendix is dilated with mucosal enhancement and surrounding inflammation, compatible with acute appendicitis. No evidence of rupture. Appendix measures 19 mm in diameter, image 66/2. Negative for abscess. No free fluid or ascites. No hemorrhage or hematoma. Vascular/Lymphatic: Infrarenal and iliac atherosclerosis noted. Negative for aneurysm. Mesenteric or renal vascular appear patent. No veno-occlusive process. No adenopathy. Reproductive: No significant finding by CT. Other: No abdominal wall hernia or abnormality. No abdominopelvic ascites. Musculoskeletal: Degenerative changes noted of the spine. IMPRESSION: Acute nonruptured appendicitis. Aortic Atherosclerosis (ICD10-I70.0). These results will be called to the ordering clinician or representative by the Radiologist Assistant, and communication documented in the PACS or [employer]. Electronically Signed   By: Jerilynn Mages.  Shick M.D.   On: 08/24/2021 16:47   DG CHEST PORT 1 VIEW  Result Date: 08/30/2021 CLINICAL DATA:  Cough EXAM: PORTABLE CHEST 1 VIEW COMPARISON:  Chest x-ray 08/27/2021 FINDINGS: Heart is enlarged. Mediastinum appears stable. Pulmonary vasculature is within normal limits. No focal consolidation identified. No significant pleural effusion or pneumothorax identified. IMPRESSION: Cardiomegaly with no acute process identified. Electronically Signed   By: Ofilia Neas M.D.   On: 08/30/2021 09:36   DG Chest Portable 1 View  Result Date: 08/27/2021 CLINICAL DATA:  Appendectomy 3 days ago with continued fever. EXAM: PORTABLE CHEST 1 VIEW COMPARISON:  PA Lat 01/29/2020, chest CT 02/14/2020 FINDINGS: The heart is slightly enlarged. No vascular  congestion is seen. No significant pleural effusion. There is a stable mediastinal configuration. There are bilateral perihilar as well as left basilar atelectatic bands not seen previously. No focal pneumonic consolidation is seen. Mild thoracic spondylosis. IMPRESSION: Perihilar and left basilar atelectatic bands. No appreciable acute infiltrate. Slight cardiomegaly. Electronically Signed   By: Telford Nab M.D.   On: 08/27/2021 22:34    Labs:  CBC: Recent Labs    08/28/21 0441 08/28/21 1945 08/29/21 0040 08/30/21 0103 09/09/21 0957  WBC 8.7  --  8.4 9.4 9.8  HGB 7.7* 7.8* 7.7* 8.1* 11.3*  HCT 22.0* 22.5* 21.6* 22.9* 33.8*  PLT 252  --  245 276 689*    COAGS: Recent Labs    08/28/21 0441 09/09/21 0956  INR 1.2 1.1    BMP: Recent Labs    08/24/21 1727 08/27/21 2105 08/29/21 0040 08/30/21 0103  NA 137 134* 139 137  K 3.4* 3.6 3.7 3.6  CL 101 99 108 103  CO2 25 23 24 25   GLUCOSE 99 143* 122* 144*  BUN 11 43* 12 8  CALCIUM 9.2 8.7* 8.0* 8.2*  CREATININE 0.88 2.27* 1.09 0.98  GFRNONAA >60 33* >60 >60    LIVER FUNCTION TESTS: Recent Labs    08/24/21 1727 08/27/21 2105  BILITOT 1.3* 0.9  AST 11* 43*  ALT 26 62*  ALKPHOS 62 58  PROT 7.3 6.7  ALBUMIN 4.6 3.9    TUMOR MARKERS: No results for input(s): AFPTM, CEA, CA199, CHROMGRNA in the last 8760 hours.  Assessment and Plan: Patient with past medical history of acute appendicitis presents with complaint of abdominal pain, intra-abdominal fluid collection.  IR consulted for aspiration and drainage at the request of Dr. Dema Severin. Case reviewed by Dr. Maryelizabeth Kaufmann who approves patient for procedure.  Patient presents today in their usual state of health.  He has been NPO and is not currently on blood thinners.  He is currently on Augmentin and plans to continue course.  Will be given 2g Ancef for procedure today per  Dr. Maryelizabeth Kaufmann.  Drain education to be provided by short stay.  Flushes rx sent to [facility].   Order placed for follow-up.  Patient understands he will hear from schedulers to arrange.   Risks and benefits discussed with the patient including bleeding, infection, damage to adjacent structures, bowel perforation/fistula connection, and sepsis.  All of the patient's questions were answered, patient is agreeable to proceed. Consent signed and in chart.  Thank you for this interesting consult.  I greatly enjoyed meeting MALOSI HEMSTREET and look forward to participating in their care.  A copy of this report was sent to the requesting provider on this date.  Electronically Signed: Docia Barrier, PA 09/09/2021, 11:00 AM   I spent a total of  30 Minutes   in face to face in clinical consultation, greater than 50% of which was counseling/coordinating care for intra-abdominal fluid collection.

## 2021-09-09 NOTE — Procedures (Signed)
Vascular and Interventional Radiology Procedure Note  Patient: Christopher Collins DOB: [birth year] Medical Record Number: [id number] Note Date/Time: 09/09/21 12:26 PM   Performing Physician: Michaelle Birks, MD Assistant(s): None  Diagnosis: Intra-abdominal collection  Procedure: DRAINAGE CATHETER PLACEMENT into RIGHT LOWER QUADRANT COLLECTION  Anesthesia: Conscious Sedation Complications: None Estimated Blood Loss: Minimal Specimens: Sent for Gram Stain, Aerobe Culture, and Anerobe Culture  Findings:  Successful CT-guided placement of 12 F drainage catheter into RLQ. Old blood, consistent with hematoma aspirated and submitted for GS / Cx.  Plan:  - Flush drain with 10 mL Normal Saline every 12 hours. - Follow up drain evaluation / sinogram in 2 week(s).  See detailed procedure note with images in PACS. The patient tolerated the procedure well without incident or complication and was returned to Recovery in stable condition.    Michaelle Birks, MD [facility] Radiology   Pager. [place]

## 2021-09-10 ENCOUNTER — Other Ambulatory Visit: Payer: Self-pay | Admitting: Surgery

## 2021-09-10 DIAGNOSIS — Z9049 Acquired absence of other specified parts of digestive tract: Secondary | ICD-10-CM

## 2021-09-10 DIAGNOSIS — K651 Peritoneal abscess: Secondary | ICD-10-CM

## 2021-09-13 LAB — AEROBIC/ANAEROBIC CULTURE W GRAM STAIN (SURGICAL/DEEP WOUND)

## 2021-09-22 ENCOUNTER — Ambulatory Visit
Admission: RE | Admit: 2021-09-22 | Discharge: 2021-09-22 | Disposition: A | Discharge status: Home / Self Care | Payer: BC Managed Care – PPO | Source: Ambulatory Visit | Attending: Surgery | Admitting: Surgery

## 2021-09-22 ENCOUNTER — Ambulatory Visit
Admission: RE | Admit: 2021-09-22 | Discharge: 2021-09-22 | Disposition: A | Discharge status: Home / Self Care | Payer: BC Managed Care – PPO | Source: Ambulatory Visit | Attending: Student | Admitting: Student

## 2021-09-22 ENCOUNTER — Encounter: Payer: Self-pay | Admitting: *Deleted

## 2021-09-22 DIAGNOSIS — K651 Peritoneal abscess: Secondary | ICD-10-CM

## 2021-09-22 DIAGNOSIS — Z9049 Acquired absence of other specified parts of digestive tract: Secondary | ICD-10-CM

## 2021-09-22 HISTORY — PX: IR RADIOLOGIST EVAL & MGMT: IMG5224

## 2021-09-22 MED ORDER — IOPAMIDOL (ISOVUE-300) INJECTION 61%
100.0000 mL | Freq: Once | INTRAVENOUS | Status: AC | PRN
  Administered 2021-09-22: 100 mL via INTRAVENOUS

## 2021-09-22 NOTE — Progress Notes (Signed)
Referring Physician(s): Dr Rosendo Gros  Chief Complaint: The patient is seen in follow up today s/p intra abdominal abscess. Post appendectomy 08/24/21 Hematoma collection drain placed in IR 09/09/21  History of present illness: Recently underwent lap appendectomy due to acute appendicitis without rupture.  Patient was initially recovering well at home, however develop abdominal pain and fever.  He was seen at Urgent Care 12/16 where imaging showed bowel thickening and small/moderate hemorrhage throughout the abdomen which did not require immediate surgical re-exploration.  Follow-up imaging 12/28 showed large 10 x 5.8 x 21 cm complex fluid collection in the right side of the abdomen extending from the inferior margin of the liver to the pelvis most consistent with an abscess.  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 x 2 yr.   Mucoid cyst of joint 03/2013   right middle finger   Sleep apnea with use of continuous positive airway pressure (CPAP)     Past Surgical History:  Procedure Laterality Date   CHOLECYSTECTOMY  2000s   FINGER SURGERY Right 03/2013   right middle finger-cyst removed   LAPAROSCOPIC APPENDECTOMY N/A 08/24/2021   Procedure: APPENDECTOMY LAPAROSCOPIC;  Surgeon: Ralene Ok, MD;  Location: [facility];  Service: General;  Laterality: N/A;   TONSILLECTOMY AND ADENOIDECTOMY     as a child    Allergies: Nuvigil [armodafinil], Smallpox  vaccine, and Doxycycline  Medications: Prior to Admission medications   Medication Sig Start Date End Date Taking? Authorizing Provider  acetaminophen (TYLENOL) 325 MG tablet Take 2 tablets (650 mg total) by mouth every 6 (six) hours as needed for mild pain or moderate pain. 08/31/21   Winferd Humphrey, PA-C  Alpha-Lipoic Acid 300 MG TABS Take 300 mg by mouth at bedtime.    [provider]  ALPRAZolam Duanne Moron) 0.25 MG tablet Take 0.125-0.25 mg by mouth at bedtime as needed for anxiety or sleep. 02/26/20   [provider]  amLODipine (NORVASC) 10 MG tablet Take 10 mg by mouth at bedtime. 08/11/21   [provider]  aspirin EC 81 MG tablet Take 1 tablet (81 mg total) by mouth daily. 02/13/20   Minus Breeding, MD  Cholecalciferol (VITAMIN D-3) 125 MCG (5000 UT) TABS Take 5,000 Units by mouth every other day.    [provider]  GINKGO BILOBA PO Take 1 tablet by mouth every morning. "EGb761"    [provider]  ibuprofen (ADVIL) 200 MG tablet Take 400 mg by mouth every 4 (four) hours as needed (pain).    [provider]  lamoTRIgine (LAMICTAL) 150 MG tablet Take 150 mg by mouth every morning. 03/20/21   [provider]  lisinopril (PRINIVIL,ZESTRIL) 10 MG tablet Take 10 mg by mouth at bedtime.    [provider]  MAGNESIUM PO Take 1 capsule by mouth at bedtime.    [provider]  mirtazapine (REMERON) 15 MG tablet Take 15 mg by mouth at bedtime. Take with a 45 mg tablet for a total nightly dose of 60 mg 03/13/21   [provider]  mirtazapine (REMERON) 45 MG tablet Take 45 mg by mouth at bedtime. Take with a 15 mg tablet for a total nightly dose of 60 mg    [provider]  mometasone (NASONEX) 50 MCG/ACT nasal spray Place 2 sprays into the nose daily as needed (seasonal allergies). 04/07/14   [provider]  montelukast (SINGULAIR) 10 MG tablet Take 1 tablet (10 mg total) by mouth daily. Patient  taking differently: Take 10 mg by mouth at bedtime. 08/10/15   Kozlow, Donnamarie Poag, MD  Multiple Vitamins-Minerals (ZINC PO) Take 1 capsule by mouth at bedtime.    [provider]  nitroGLYCERIN (NITROSTAT) 0.4 MG SL tablet Place 1 tablet (0.4 mg total) under the tongue every 5 (five) minutes as needed for chest pain. 02/13/20 08/28/21  Minus Breeding, MD  ondansetron (ZOFRAN) 4 MG tablet Take 1 tablet (4 mg total) by mouth daily as needed for nausea or vomiting. 08/24/21 08/24/22  Ralene Ok, MD  PREVIDENT 5000 ENAMEL PROTECT 1.1-5 % GEL Place 1 application onto teeth 2 (two) times daily. 11/12/20   [provider]  rosuvastatin (CRESTOR) 10 MG tablet Take 10 mg by mouth every morning. Take with a 5 mg tablet for a total daily dose of 15 mg    [provider]  rosuvastatin (CRESTOR) 5 MG tablet Take 5 mg by mouth every morning. Take with a 10 mg tablet for a total daily dose of 15 mg 08/25/21   [provider]  Sodium Chloride Flush (NORMAL SALINE FLUSH) 0.9 % SOLN Flush drainage catheter 1-2 times daily with 5-10 mL. 09/09/21   Matthews, Herma Carson, PA  SYSTANE 0.4-0.3 % SOLN Place 1 drop into both eyes every 2 (two) hours.    [provider]     Family History  Problem Relation Age of Onset   Lung cancer Father    AAA (abdominal aortic aneurysm) Father    Heart attack Paternal Grandfather 43   AAA (abdominal aortic aneurysm) Paternal 62        Died from this age 68    Social History   Socioeconomic History   Marital status: Single    Spouse name: Not on file   Number of children: 0   Years of education: PHD   Highest education level: Not on file  Occupational History    Employer: [employer]    Comment: non shift worker  Tobacco Use   Smoking status: Never    Passive exposure: Past   Smokeless tobacco: Never  Vaping Use   Vaping Use: Never used  Substance and Sexual Activity   Alcohol use: Not Currently    Comment:  None for the last year   Drug use: No   Sexual activity: Not Currently  Other Topics Concern   Not on file  Social History Narrative   [place] on 8 cm CPAP is down to 8 from 11 cm, but nasal pillow doesn't fit as well as expected. Has used CPAP four or more hours nightly, 100% compliance.   His download  showed an AHI of 5.9 and mostly consists of  central apneas.  This indicated too high of a pressure. Suggested reduction to 7 cm water and 3 cm flex and a pilaire mask to avoid dislodgement and an  air leak.  [facility] is DME.   BMI addressed with PCP.  Visit 30 minutes.    Lives alone.   Advertising account planner.         Social Determinants of Health   Financial Resource Strain: Not on file  Food Insecurity: Not on file  Transportation Needs: Not on file  Physical Activity: Not on file  Stress: Not on file  Social Connections: Not on file     Vital Signs: There were no vitals taken for this visit.  Physical Exam Skin:    General: Skin is warm.     Comments: Site is clean and dry No bleeding No infection  Drains flushes and aspirates easily  Drain removal without complication per Dr Laurence Ferrari Dressing placed      Imaging: No results found.  Labs:  CBC: Recent Labs    08/28/21 0441 08/28/21 1945 08/29/21 0040 08/30/21 0103 09/09/21 0957  WBC 8.7  --  8.4 9.4 9.8  HGB 7.7* 7.8* 7.7* 8.1* 11.3*  HCT 22.0* 22.5* 21.6* 22.9* 33.8*  PLT 252  --  245 276 689*    COAGS: Recent Labs    08/28/21 0441 09/09/21 0956  INR 1.2 1.1    BMP: Recent Labs    08/24/21 1727 08/27/21 2105 08/29/21 0040 08/30/21 0103  NA 137 134* 139 137  K 3.4* 3.6 3.7 3.6  CL 101 99 108 103  CO2 25 23 24 25   GLUCOSE 99 143* 122* 144*  BUN 11 43* 12 8  CALCIUM 9.2 8.7* 8.0* 8.2*  CREATININE 0.88 2.27* 1.09 0.98  GFRNONAA >60 33* >60 >60    LIVER FUNCTION TESTS: Recent Labs    08/24/21 1727 08/27/21 2105  BILITOT 1.3* 0.9  AST 11* 43*  ALT 26 62*  ALKPHOS 62 58  PROT 7.3 6.7   ALBUMIN 4.6 3.9    Assessment:  Post lap appendectomy 08/24/21 Hematoma - drain placed 09/09/21 in IR Has done well OP minimal to none CT today showing resolving hematoma collection Drain removal per Dr Laurence Ferrari Follow up with Dr Rosendo Gros Friday 09/24/21  Signed: Lavonia Drafts, PA-C 09/22/2021, 1:49 PM   Please refer to Dr. Laurence Ferrari attestation of this note for management and plan.

## 2021-10-01 ENCOUNTER — Other Ambulatory Visit: Payer: Self-pay

## 2021-10-01 ENCOUNTER — Ambulatory Visit (INDEPENDENT_AMBULATORY_CARE_PROVIDER_SITE_OTHER): Payer: Self-pay | Admitting: *Deleted

## 2021-10-01 DIAGNOSIS — B351 Tinea unguium: Secondary | ICD-10-CM

## 2021-10-01 NOTE — Progress Notes (Signed)
Patient presents today for the 3rd laser treatment for the left hallux. Diagnosed with mycotic nail infection by Dr. Posey Pronto.   Toenail most affected nail is the hallux bilateral. They are looking better.  ~Trimmed all the nails today, at patient's request~  All other systems are negative.  Nails were filed thin. Laser therapy was administered to 1st toenails bilateral and patient tolerated the treatment well. All safety precautions were in place.    Patient will follow up every 4 weeks until both hallux nails have improved, per Dr. Posey Pronto.

## 2021-10-27 ENCOUNTER — Encounter: Payer: Self-pay | Admitting: Podiatry

## 2021-10-28 ENCOUNTER — Other Ambulatory Visit: Payer: Self-pay | Admitting: Podiatry

## 2021-10-28 MED ORDER — CEPHALEXIN 500 MG PO CAPS: 500.0000 mg | ORAL_CAPSULE | Freq: Three times a day (TID) | ORAL | 0 refills | Status: DC

## 2021-10-29 ENCOUNTER — Ambulatory Visit (INDEPENDENT_AMBULATORY_CARE_PROVIDER_SITE_OTHER): Payer: BC Managed Care – PPO

## 2021-10-29 ENCOUNTER — Other Ambulatory Visit: Payer: Self-pay

## 2021-10-29 DIAGNOSIS — B351 Tinea unguium: Secondary | ICD-10-CM

## 2021-10-29 NOTE — Progress Notes (Signed)
Patient presents today for the 4th laser treatment for the left hallux. Diagnosed with mycotic nail infection by Dr. Posey Pronto.   Toenail most affected nail is the hallux bilateral. They are looking better.  ~Trimmed all the nails today, at patient's request~  All other systems are negative.  Nails were filed thin. Laser therapy was administered to 1st toenails bilateral and patient tolerated the treatment well. All safety precautions were in place.    Patient will follow up every 4 weeks until both hallux nails have improved, per Dr. Posey Pronto.

## 2021-11-26 ENCOUNTER — Other Ambulatory Visit: Payer: Self-pay

## 2021-11-26 ENCOUNTER — Ambulatory Visit (INDEPENDENT_AMBULATORY_CARE_PROVIDER_SITE_OTHER): Payer: BC Managed Care – PPO

## 2021-11-26 DIAGNOSIS — B351 Tinea unguium: Secondary | ICD-10-CM

## 2021-11-26 NOTE — Progress Notes (Signed)
Patient presents today for the 5th laser treatment for the left hallux. Diagnosed with mycotic nail infection by Dr. Posey Pronto.  ? ?Toenail most affected nail is the hallux bilateral. They are looking better. ? ?~Trimmed all the nails today, at patient's request~ ? ?All other systems are negative. ? ?Nails were filed thin. Laser therapy was administered to 1st toenails bilateral and patient tolerated the treatment well. All safety precautions were in place.  ? ? ?Patient will follow up every 4 weeks until both hallux nails have improved, per Dr. Posey Pronto. ?

## 2021-12-27 ENCOUNTER — Other Ambulatory Visit: Payer: Self-pay

## 2021-12-29 ENCOUNTER — Ambulatory Visit (INDEPENDENT_AMBULATORY_CARE_PROVIDER_SITE_OTHER): Payer: Self-pay

## 2021-12-29 DIAGNOSIS — B351 Tinea unguium: Secondary | ICD-10-CM

## 2021-12-29 NOTE — Progress Notes (Signed)
Patient presents today for the 6th laser treatment for the left hallux. Diagnosed with mycotic nail infection by Dr. Posey Pronto.  ? ?Toenail most affected nail is the hallux bilateral. They are looking better. ? ?~Trimmed all the nails today, at patient's request~ ? ?All other systems are negative. ? ?Nails were filed thin. Laser therapy was administered to 1st toenails bilateral and patient tolerated the treatment well. All safety precautions were in place.  ? ? ?Patient will follow up every 4 weeks until both hallux nails have improved, per Dr. Posey Pronto. ?

## 2022-02-08 ENCOUNTER — Ambulatory Visit: Payer: BC Managed Care – PPO

## 2022-02-08 DIAGNOSIS — B351 Tinea unguium: Secondary | ICD-10-CM

## 2022-02-08 NOTE — Progress Notes (Unsigned)
Patient presents today for the 8th laser treatment for the left hallux. Diagnosed with mycotic nail infection by Dr. Posey Pronto.   Toenail most affected nail is the hallux bilateral. Lateral borders of both hallux still have room for improvement.   Right 5th nail was also added to treatment. Pt states Nail fungus was not present during last visit.   All other systems are negative.  Nails were filed thin. Laser therapy was administered to 1st toenails bilateral and patient tolerated the treatment well. All safety precautions were in place.    Patient will follow up every 4 weeks until both hallux nails have improved, per Dr. Posey Pronto.

## 2022-03-11 ENCOUNTER — Ambulatory Visit (INDEPENDENT_AMBULATORY_CARE_PROVIDER_SITE_OTHER): Payer: Self-pay

## 2022-03-11 DIAGNOSIS — B351 Tinea unguium: Secondary | ICD-10-CM

## 2022-03-11 NOTE — Progress Notes (Signed)
Patient presents today for the 9th laser treatment for the left hallux. Diagnosed with mycotic nail infection by Dr. Posey Pronto.   Toenail most affected nail is the hallux bilateral. Lateral borders of both hallux still have room for improvement.   Right 5th nail was also added to treatment. Pt states Nail fungus was not present during last visit.   All other systems are negative.  Nails were filed thin. Laser therapy was administered to 1st toenails bilateral and patient tolerated the treatment well. All safety precautions were in place.    Patient will follow up every 4 weeks until both hallux nails have improved, per Dr. Posey Pronto.

## 2022-03-24 ENCOUNTER — Ambulatory Visit (INDEPENDENT_AMBULATORY_CARE_PROVIDER_SITE_OTHER): Payer: BC Managed Care – PPO | Admitting: Podiatry

## 2022-03-24 DIAGNOSIS — B351 Tinea unguium: Secondary | ICD-10-CM | POA: Diagnosis not present

## 2022-03-31 NOTE — Progress Notes (Signed)
Subjective:  Patient ID: Christopher Collins, male    DOB: Nov 04, 1965,  MRN: [id number]  Chief Complaint  Patient presents with   Nail Problem    56 y.o. male presents with the above complaint.  Patient presents for follow-up of bilateral hallux and second digit onychomycosis.  He states laser therapy has helped.  He wants to make sure that they are doing okay.  He would like to discuss maintenance laser if needed.   Review of Systems: Negative except as noted in the HPI. Denies N/V/F/Ch.  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 x 2 yr.   Mucoid cyst of joint 03/2013   right middle finger   Sleep apnea with use of continuous positive airway pressure (CPAP)     Current Outpatient Medications:    acetaminophen (TYLENOL) 325 MG tablet, Take 2 tablets (650 mg total) by mouth every 6 (six) hours as needed for mild pain or moderate pain., Disp: , Rfl:    Alpha-Lipoic Acid 300 MG TABS, Take 300 mg by mouth at bedtime., Disp: , Rfl:    ALPRAZolam (XANAX) 0.25 MG tablet, Take 0.125-0.25 mg by mouth at bedtime as needed for anxiety or sleep., Disp: , Rfl:    amLODipine (NORVASC) 10 MG tablet, Take 10 mg by mouth at bedtime., Disp: , Rfl:    aspirin EC 81 MG tablet, Take 1 tablet (81 mg total) by mouth daily., Disp: 90 tablet, Rfl: 3   cephALEXin (KEFLEX) 500 MG capsule, Take 1 capsule (500 mg total) by mouth 3 (three) times daily., Disp: 30 capsule, Rfl: 0   Cholecalciferol (VITAMIN D-3) 125 MCG (5000 UT) TABS, Take 5,000 Units by mouth every other day., Disp: , Rfl:    GINKGO BILOBA PO, Take 1 tablet by mouth every morning. "EGb761", Disp: , Rfl:    ibuprofen (ADVIL) 200 MG tablet, Take 400 mg by mouth every 4 (four)  hours as needed (pain)., Disp: , Rfl:    lamoTRIgine (LAMICTAL) 150 MG tablet, Take 150 mg by mouth every morning., Disp: , Rfl:    lisinopril (PRINIVIL,ZESTRIL) 10 MG tablet, Take 10 mg by mouth at bedtime., Disp: , Rfl:    MAGNESIUM PO, Take 1 capsule by mouth at bedtime., Disp: , Rfl:    mirtazapine (REMERON) 15 MG tablet, Take 15 mg by mouth at bedtime. Take with a 45 mg tablet for a total nightly dose of 60 mg, Disp: , Rfl:    mirtazapine (REMERON) 45 MG tablet, Take 45 mg by mouth at bedtime. Take with a 15 mg tablet for a total nightly dose of 60 mg, Disp: , Rfl:    mometasone (NASONEX) 50 MCG/ACT nasal spray, Place 2 sprays into the nose daily as needed (seasonal allergies)., Disp: , Rfl:    montelukast (SINGULAIR) 10 MG tablet, Take 1 tablet (10 mg total) by mouth daily. (Patient taking differently: Take 10 mg by mouth at bedtime.), Disp: 30 tablet, Rfl: 0   Multiple Vitamins-Minerals (ZINC PO), Take 1 capsule by mouth at bedtime., Disp: , Rfl:    nitroGLYCERIN (NITROSTAT) 0.4 MG SL tablet, Place 1 tablet (0.4 mg total) under  the tongue every 5 (five) minutes as needed for chest pain., Disp: 25 tablet, Rfl: 3   ondansetron (ZOFRAN) 4 MG tablet, Take 1 tablet (4 mg total) by mouth daily as needed for nausea or vomiting., Disp: 30 tablet, Rfl: 1   PREVIDENT 5000 ENAMEL PROTECT 1.1-5 % GEL, Place 1 application onto teeth 2 (two) times daily., Disp: , Rfl:    rosuvastatin (CRESTOR) 10 MG tablet, Take 10 mg by mouth every morning. Take with a 5 mg tablet for a total daily dose of 15 mg, Disp: , Rfl:    rosuvastatin (CRESTOR) 5 MG tablet, Take 5 mg by mouth every morning. Take with a 10 mg tablet for a total daily dose of 15 mg, Disp: , Rfl:    Sodium Chloride Flush (NORMAL SALINE FLUSH) 0.9 % SOLN, Flush drainage catheter 1-2 times daily with 5-10 mL., Disp: 300 mL, Rfl: 0   SYSTANE 0.4-0.3 % SOLN, Place 1 drop into both eyes every 2 (two) hours., Disp: , Rfl:   Social History   Tobacco Use   Smoking Status Never   Passive exposure: Past  Smokeless Tobacco Never    Allergies  Allergen Reactions   Nuvigil [Armodafinil] Other (See Comments)    Anxiety and tinnitis 2014 after taking just 2 tabs.   Smallpox Virus Vaccine Live Other (See Comments)    Made patient sick with small pox symptoms   Doxycycline Other (See Comments)     Increases tinnitis   Objective:  There were no vitals filed for this visit. There is no height or weight on file to calculate BMI. Constitutional Well developed. Well nourished.  Vascular Dorsalis pedis pulses palpable bilaterally. Posterior tibial pulses palpable bilaterally. Capillary refill normal to all digits.  No cyanosis or clubbing noted. Pedal hair growth normal.  Neurologic Normal speech. Oriented to person, place, and time. Epicritic sensation to light touch grossly present bilaterally.  Dermatologic Much improved discolored thickened dystrophic nail noted to the right hallux/bilateral hallux and second digit.  Orthopedic: Normal joint ROM without pain or crepitus bilaterally. No visible deformities. No bony tenderness.   Radiographs: None Assessment:   1. Onychomycosis due to dermatophyte   2. Nail fungus     Plan:  Patient was evaluated and treated and all questions answered.  Bilateral hallux and second digit onychomycosis -Patient is nail has improved.  At this time we will continue laser as needed.  If any foot and ankle issues on future asked to come back and see me.  He states understanding.    No follow-ups on file.

## 2022-04-08 ENCOUNTER — Ambulatory Visit (INDEPENDENT_AMBULATORY_CARE_PROVIDER_SITE_OTHER): Payer: BC Managed Care – PPO | Admitting: Podiatry

## 2022-04-08 DIAGNOSIS — B351 Tinea unguium: Secondary | ICD-10-CM

## 2022-04-08 NOTE — Progress Notes (Signed)
Patient presents today for the 10th laser treatment for the left hallux. Diagnosed with mycotic nail infection by Dr. Posey Pronto.    Toenail most affected nail is the hallux bilateral. Lateral borders of both hallux still have room for improvement.    All other systems are negative.   Nails were filed thin. Laser therapy was administered to 1st toenails bilateral and patient tolerated the treatment well. All safety precautions were in place.   Follow up in 6 weeks for laser #11.

## 2022-05-05 ENCOUNTER — Ambulatory Visit (INDEPENDENT_AMBULATORY_CARE_PROVIDER_SITE_OTHER): Payer: BC Managed Care – PPO | Admitting: Podiatry

## 2022-05-05 DIAGNOSIS — B351 Tinea unguium: Secondary | ICD-10-CM

## 2022-05-05 NOTE — Progress Notes (Signed)
Patient presents today for the 11th laser treatment for the left hallux. Diagnosed with mycotic nail infection by Dr. Posey Pronto.    Toenail most affected nail is the hallux bilateral. Lateral borders of both hallux still have room for improvement.    All other systems are negative.   Nails were filed thin. Laser therapy was administered to 1st toenails bilateral and patient tolerated the treatment well. All safety precautions were in place.    Patient would like to complete one more laser treatment to ensure the fungus is completely gone.   Will follow up in 6 weeks for laser #12.

## 2022-05-10 ENCOUNTER — Other Ambulatory Visit (HOSPITAL_COMMUNITY): Payer: Self-pay

## 2022-05-19 DIAGNOSIS — E118 Type 2 diabetes mellitus with unspecified complications: Secondary | ICD-10-CM | POA: Insufficient documentation

## 2022-05-19 DIAGNOSIS — R931 Abnormal findings on diagnostic imaging of heart and coronary circulation: Secondary | ICD-10-CM | POA: Insufficient documentation

## 2022-05-19 NOTE — Progress Notes (Signed)
Cardiology Office Note   Date:  05/20/2022   ID:  Christopher Collins, DOB 08/12/1966, MRN [id number]  PCP:  Glenis Smoker, MD  Cardiologist:   None Referring:  Glenis Smoker, MD   Chief Complaint  Patient presents with   Coronary Artery Disease       History of Present Illness: Christopher Collins is a 56 y.o. male who presents for evaluation of chest pain.   He was in the ED for this.  There was no evidence of ischemia.    I sent him for a POET (Plain Old Exercise Treadmill) which was negative but inadequate for heart rate.  He returns for follow-up.  He has had significant problems following an appendectomy.  He has had no cardiovascular problems.  He does get some discomfort with burping but this is not different than previous.  He had not been walking but he is just started getting back to this.  Is not having any new shortness of breath, PND or orthopnea.  Is not having any new palpitations, presyncope or syncope.   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 x 2 yr.   Mucoid cyst of joint 03/2013   right middle finger   Sleep apnea with use of continuous positive airway pressure (CPAP)     Past Surgical History:  Procedure Laterality Date   CHOLECYSTECTOMY  2000s   FINGER SURGERY Right 03/2013   right middle finger-cyst removed   IR RADIOLOGIST EVAL & MGMT  09/22/2021   LAPAROSCOPIC APPENDECTOMY N/A 08/24/2021   Procedure: APPENDECTOMY LAPAROSCOPIC;  Surgeon: Ralene Ok, MD;  Location: [facility];  Service: General;  Laterality: N/A;   TONSILLECTOMY AND ADENOIDECTOMY     as a child     Current Outpatient Medications  Medication Sig Dispense Refill   acetaminophen (TYLENOL)  325 MG tablet Take 2 tablets (650 mg total) by mouth every 6 (six) hours as needed for mild pain or moderate pain.     Alpha-Lipoic Acid 300 MG TABS Take 300 mg by mouth at bedtime.     ALPRAZolam (XANAX) 0.25 MG tablet Take 0.125-0.25 mg by mouth at bedtime as needed for anxiety or sleep.     amLODipine (NORVASC) 5 MG tablet Take 1 tablet by mouth daily.     aspirin EC 81 MG tablet Take 1 tablet (81 mg total) by mouth daily. 90 tablet 3   Cholecalciferol (VITAMIN D-3) 125 MCG (5000 UT) TABS Take 5,000 Units by mouth every other day.     GINKGO BILOBA PO Take 1 tablet by mouth every morning. "EGb761"     ibuprofen (ADVIL) 200 MG tablet Take 400 mg by mouth every 4 (four) hours as needed (pain).     lamoTRIgine (LAMICTAL) 150 MG tablet Take 150 mg by mouth every morning.     lamoTRIgine (LAMICTAL) 25 MG tablet Take 25 mg by mouth daily.     lisinopril (PRINIVIL,ZESTRIL) 10 MG tablet Take 10 mg by mouth at bedtime.     MAGNESIUM PO Take 1 capsule by mouth at bedtime.     mirtazapine (REMERON)  15 MG tablet Take 15 mg by mouth at bedtime. Take with a 45 mg tablet for a total nightly dose of 60 mg     mirtazapine (REMERON) 45 MG tablet Take 45 mg by mouth at bedtime. Take with a 15 mg tablet for a total nightly dose of 60 mg     mometasone (NASONEX) 50 MCG/ACT nasal spray Place 2 sprays into the nose daily as needed (seasonal allergies).     Multiple Vitamins-Minerals (ZINC PO) Take 1 capsule by mouth at bedtime.     ondansetron (ZOFRAN) 4 MG tablet Take 1 tablet (4 mg total) by mouth daily as needed for nausea or vomiting. 30 tablet 1   PREVIDENT 5000 ENAMEL PROTECT 1.1-5 % GEL Place 1 application onto teeth 2 (two) times daily.     rosuvastatin (CRESTOR) 10 MG tablet Take 10 mg by mouth every morning. Take with a 5 mg tablet for a total daily dose of 15 mg     rosuvastatin (CRESTOR) 5 MG tablet Take 5 mg by mouth every morning. Take with a 10 mg tablet for a total daily dose of 15 mg     Sodium  Chloride Flush (NORMAL SALINE FLUSH) 0.9 % SOLN Flush drainage catheter 1-2 times daily with 5-10 mL. 300 mL 0   SYSTANE 0.4-0.3 % SOLN Place 1 drop into both eyes every 2 (two) hours.     amLODipine (NORVASC) 10 MG tablet Take 10 mg by mouth at bedtime. (Patient not taking: Reported on 05/20/2022)     cephALEXin (KEFLEX) 500 MG capsule Take 1 capsule (500 mg total) by mouth 3 (three) times daily. (Patient not taking: Reported on 05/20/2022) 30 capsule 0   montelukast (SINGULAIR) 10 MG tablet Take 1 tablet (10 mg total) by mouth daily. (Patient not taking: Reported on 05/20/2022) 30 tablet 0   nitroGLYCERIN (NITROSTAT) 0.4 MG SL tablet Place 1 tablet (0.4 mg total) under the tongue every 5 (five) minutes as needed for chest pain. 25 tablet 3   No current facility-administered medications for this visit.    Allergies:   Nuvigil [armodafinil], Smallpox virus vaccine live, and Doxycycline    ROS:  Please see the history of present illness.   Otherwise, review of systems are positive for none.   All other systems are reviewed and negative.    PHYSICAL EXAM: VS:  BP 136/80 (BP Location: Left Arm, Patient Position: Sitting, Cuff Size: Normal)   Pulse 64   Ht '6\' 5"'$  (1.956 m)   Wt 231 lb (104.8 kg)   BMI 27.39 kg/m  , BMI Body mass index is 27.39 kg/m. GENERAL:  Well appearing NECK:  No jugular venous distention, waveform within normal limits, carotid upstroke brisk and symmetric, no bruits, no thyromegaly LUNGS:  Clear to auscultation bilaterally CHEST:  Unremarkable HEART:  PMI not displaced or sustained,S1 and S2 within normal limits, no S3, no S4, no clicks, no rubs, no murmurs ABD:  Flat, positive bowel sounds normal in frequency in pitch, no bruits, no rebound, no guarding, no midline pulsatile mass, no hepatomegaly, no splenomegaly EXT:  2 plus pulses throughout, no edema, no cyanosis no clubbing   EKG:  EKG is  ordered today. Sinus rhythm, rate 64, axis within normal limits, intervals  within normal limits, no acute ST-T wave changes.  CT  Coronary Arteries:  Normal coronary origin.  Right dominance.   RCA is a large dominant artery that gives rise to PDA and two PLV branches. There is minimal (<25%) calcified  plaque proximally, mild (25-49%) mixed plaque in the mid RCA, and minimal distal stenosis.   Left main is a large artery that gives rise to LAD and LCX arteries. There is minimal (<25%) calcified plaque distally.   LAD is a large vessel that has minimal (<25%) calcified plaque proximally and mild (25-49%) calcified plaque in the mid LAD and minimal plaque distally. There is a large, branching D1 with minimal plaque and small D2 and D3 without significant disease.   LCX is a non-dominant artery that gives rise to three small OM branches. There is minimal (<25%) soft plaque.    Recent Labs: 08/27/2021: ALT 62 08/30/2021: BUN 8; Creatinine, Ser 0.98; Potassium 3.6; Sodium 137 09/09/2021: Hemoglobin 11.3; Platelets 689    Lipid Panel No results found for: "CHOL", "TRIG", "HDL", "CHOLHDL", "VLDL", "LDLCALC", "LDLDIRECT"    Wt Readings from Last 3 Encounters:  05/20/22 231 lb (104.8 kg)  09/09/21 220 lb (99.8 kg)  08/27/21 220 lb (99.8 kg)      Other studies Reviewed: Additional studies/ records that were reviewed today include: Labs Review of the above records demonstrates:  Please see elsewhere in the note.     ASSESSMENT AND PLAN:  ELEVATED CORONARY CALCIUM:   He had a negative but inadequate POET (Plain Old Exercise Treadmill).     His chest discomfort is not anginal.  We are going to continue with risk reduction.  We had another long discussion about this.    HTN: His blood pressure is mildly elevated and he is going to keep a blood pressure diary.  I will increase his lisinopril if he is not at target.   RISK REDUCTION: His LDL was 64 last time.  I am going to check a lipid profile and an LP(a).    Current medicines are reviewed at length  with the patient today.  The patient does not have concerns regarding medicines.  The following changes have been made:  None  Labs/ tests ordered today include:  None   Orders Placed This Encounter  Procedures   Lipid panel   Lipoprotein A (LPA)   EKG 12-Lead      Disposition:   FU with me 12 months.     Signed, Minus Breeding, MD  05/20/2022 3:24 PM    [facility]

## 2022-05-20 ENCOUNTER — Encounter: Payer: Self-pay | Admitting: Cardiology

## 2022-05-20 ENCOUNTER — Ambulatory Visit: Payer: BC Managed Care – PPO | Attending: Cardiology | Admitting: Cardiology

## 2022-05-20 VITALS — BP 136/80 | HR 64 | Ht 77.0 in | Wt 231.0 lb

## 2022-05-20 DIAGNOSIS — I10 Essential (primary) hypertension: Secondary | ICD-10-CM

## 2022-05-20 DIAGNOSIS — R931 Abnormal findings on diagnostic imaging of heart and coronary circulation: Secondary | ICD-10-CM | POA: Diagnosis not present

## 2022-05-20 DIAGNOSIS — E118 Type 2 diabetes mellitus with unspecified complications: Secondary | ICD-10-CM

## 2022-05-20 NOTE — Patient Instructions (Signed)
Medication Instructions:  Your physician recommends that you continue on your current medications as directed. Please refer to the Current Medication list given to you today.  *If you need a refill on your cardiac medications before your next appointment, please call your pharmacy*   Lab Work: Please return for FASTING labs (Lipid, LP(a))  Our in office lab hours are Monday-Friday 8:00-4:00, closed for lunch 12:45-1:45 pm.  No appointment needed.  [facility] locations:   [place] ([place]) - [address] - [address] [place] - [address] ([facility])  Follow-Up: At [facility], you and your health needs are our priority.  As part of our continuing mission to provide you with exceptional heart care, we have created designated Provider Care Teams.  These Care Teams include your primary Cardiologist (physician) and Advanced Practice Providers (APPs -  Physician Assistants and Nurse Practitioners) who all work together to provide you with the care you need, when you need it.  We recommend signing up for the patient portal called "MyChart".  Sign up information is provided on this After Visit Summary.  MyChart is used to connect with patients for Virtual Visits (Telemedicine).  Patients are able to view lab/test results, encounter notes, upcoming appointments, etc.  Non-urgent messages can be sent to your provider as well.   To learn more about what you can do with MyChart, go to [email or web address].    Your next appointment:   12 month(s)  The format for your next appointment:   In Person  Provider:   Dr. Percival Spanish  Important Information About  [place]

## 2022-06-04 ENCOUNTER — Emergency Department (HOSPITAL_BASED_OUTPATIENT_CLINIC_OR_DEPARTMENT_OTHER)
Admission: EM | Admit: 2022-06-04 | Discharge: 2022-06-04 | Disposition: A | Discharge status: Home / Self Care | Payer: BC Managed Care – PPO | Attending: Emergency Medicine | Admitting: Emergency Medicine

## 2022-06-04 ENCOUNTER — Encounter (HOSPITAL_BASED_OUTPATIENT_CLINIC_OR_DEPARTMENT_OTHER): Payer: Self-pay | Admitting: *Deleted

## 2022-06-04 ENCOUNTER — Emergency Department (HOSPITAL_BASED_OUTPATIENT_CLINIC_OR_DEPARTMENT_OTHER): Payer: BC Managed Care – PPO

## 2022-06-04 DIAGNOSIS — S91311A Laceration without foreign body, right foot, initial encounter: Secondary | ICD-10-CM | POA: Diagnosis not present

## 2022-06-04 DIAGNOSIS — S99921A Unspecified injury of right foot, initial encounter: Secondary | ICD-10-CM | POA: Diagnosis present

## 2022-06-04 DIAGNOSIS — Z7982 Long term (current) use of aspirin: Secondary | ICD-10-CM | POA: Insufficient documentation

## 2022-06-04 DIAGNOSIS — W260XXA Contact with knife, initial encounter: Secondary | ICD-10-CM | POA: Insufficient documentation

## 2022-06-04 DIAGNOSIS — Z79899 Other long term (current) drug therapy: Secondary | ICD-10-CM | POA: Insufficient documentation

## 2022-06-04 MED ORDER — CEPHALEXIN 500 MG PO CAPS: 500.0000 mg | ORAL_CAPSULE | Freq: Two times a day (BID) | ORAL | 0 refills | Status: DC

## 2022-06-04 MED ORDER — CEPHALEXIN 250 MG PO CAPS
500.0000 mg | ORAL_CAPSULE | Freq: Once | ORAL | Status: AC
  Administered 2022-06-04: 500 mg via ORAL
  Filled 2022-06-04: qty 2

## 2022-06-04 MED ORDER — CEPHALEXIN 500 MG PO CAPS: 500.0000 mg | ORAL_CAPSULE | Freq: Two times a day (BID) | ORAL | 0 refills | Status: AC

## 2022-06-04 MED ORDER — LIDOCAINE-EPINEPHRINE (PF) 2 %-1:200000 IJ SOLN
INTRAMUSCULAR | Status: AC
  Filled 2022-06-04: qty 20

## 2022-06-04 NOTE — ED Triage Notes (Addendum)
Patient was at home and dropped knife off of stove and landed straight down into anterior right foot, distal to 5th toe. Unable to wiggle 5th digit. Bleeding controlled.

## 2022-06-04 NOTE — ED Provider Notes (Signed)
[facility] EMERGENCY DEPT Provider Note   CSN: [id number] Arrival date & time: 06/04/22  1311     History  Chief Complaint  Patient presents with   Foot Injury    Christopher Collins is a 56 y.o. male.  Patient here with laceration to the right foot.  He dropped the knife on his right foot prior to arrival.  He states he is unable to flex back to his right pinky toe.  Not sure if his tetanus shot is up-to-date.  Denies any other injuries.  Not on blood thinners.  No other significant medical problems.  The history is provided by the patient.       Home Medications Prior to Admission medications   Medication Sig Start Date End Date Taking? Authorizing Provider  cephALEXin (KEFLEX) 500 MG capsule Take 1 capsule (500 mg total) by mouth 2 (two) times daily for 7 days. 06/04/22 06/11/22 Yes Glema Takaki, DO  acetaminophen (TYLENOL) 325 MG tablet Take 2 tablets (650 mg total) by mouth every 6 (six) hours as needed for mild pain or moderate pain. 08/31/21   Winferd Humphrey, PA-C  Alpha-Lipoic Acid 300 MG TABS Take 300 mg by mouth at bedtime.    [provider]  ALPRAZolam Duanne Moron) 0.25 MG tablet Take 0.125-0.25 mg by mouth at bedtime as needed for anxiety or sleep. 02/26/20   [provider]  amLODipine (NORVASC) 10 MG tablet Take 10 mg by mouth at bedtime. Patient not taking: Reported on 05/20/2022 08/11/21   [provider]  amLODipine (NORVASC) 5 MG tablet Take 1 tablet by mouth daily.    [provider]  aspirin EC 81 MG tablet Take 1 tablet (81 mg total) by mouth daily. 02/13/20   Minus Breeding, MD  Cholecalciferol (VITAMIN D-3) 125 MCG (5000 UT) TABS Take 5,000 Units by mouth every other day.    [provider]  GINKGO BILOBA PO Take 1 tablet by mouth every morning. "EGb761"    [provider]  ibuprofen (ADVIL) 200 MG tablet Take 400 mg by mouth every 4 (four) hours as needed (pain).    [provider]   lamoTRIgine (LAMICTAL) 150 MG tablet Take 150 mg by mouth every morning. 03/20/21   [provider]  lamoTRIgine (LAMICTAL) 25 MG tablet Take 25 mg by mouth daily. 03/03/22   [provider]  lisinopril (PRINIVIL,ZESTRIL) 10 MG tablet Take 10 mg by mouth at bedtime.    [provider]  MAGNESIUM PO Take 1 capsule by mouth at bedtime.    [provider]  mirtazapine (REMERON) 15 MG tablet Take 15 mg by mouth at bedtime. Take with a 45 mg tablet for a total nightly dose of 60 mg 03/13/21   [provider]  mirtazapine (REMERON) 45 MG tablet Take 45 mg by mouth at bedtime. Take with a 15 mg tablet for a total nightly dose of 60 mg    [provider]  mometasone (NASONEX) 50 MCG/ACT nasal spray Place 2 sprays into the nose daily as needed (seasonal allergies). 04/07/14   [provider]  montelukast (SINGULAIR) 10 MG tablet Take 1 tablet (10 mg total) by mouth daily. Patient not taking: Reported on 05/20/2022 08/10/15   Jiles Prows, MD  Multiple Vitamins-Minerals (ZINC PO) Take 1 capsule by mouth at bedtime.    [provider]  nitroGLYCERIN (NITROSTAT) 0.4 MG SL tablet Place 1 tablet (0.4 mg total) under the tongue every 5 (five) minutes as needed for  chest pain. 02/13/20 08/28/21  Minus Breeding, MD  ondansetron (ZOFRAN) 4 MG tablet Take 1 tablet (4 mg total) by mouth daily as needed for nausea or vomiting. 08/24/21 08/24/22  Ralene Ok, MD  PREVIDENT 5000 ENAMEL PROTECT 1.1-5 % GEL Place 1 application onto teeth 2 (two) times daily. 11/12/20   [provider]  rosuvastatin (CRESTOR) 10 MG tablet Take 10 mg by mouth every morning. Take with a 5 mg tablet for a total daily dose of 15 mg    [provider]  rosuvastatin (CRESTOR) 5 MG tablet Take 5 mg by mouth every morning. Take with a 10 mg tablet for a total daily dose of 15 mg 08/25/21   [provider]  Sodium Chloride Flush (NORMAL SALINE FLUSH) 0.9 %  SOLN Flush drainage catheter 1-2 times daily with 5-10 mL. 09/09/21   Matthews, Herma Carson, PA  SYSTANE 0.4-0.3 % SOLN Place 1 drop into both eyes every 2 (two) hours.    [provider]      Allergies    Nuvigil [armodafinil], Smallpox virus vaccine live, and Doxycycline    Review of Systems   Review of Systems  Physical Exam Updated Vital Signs BP (!) 142/99 (BP Location: Right Arm)   Pulse 82   Temp 98.4 F (36.9 C)   Resp 18   SpO2 99%  Physical Exam HENT:     Head: Normocephalic.  Cardiovascular:     Pulses: Normal pulses.  Pulmonary:     Effort: Pulmonary effort is normal.  Musculoskeletal:        General: Tenderness present. No swelling or deformity.     Comments: Tenderness to the lateral portion of the right foot, there is a 1 cm superficial laceration to the top of the right foot, he is able to flex all of his toes on the right but does not appear to have great extension of the right pinky toe  Skin:    General: Skin is warm.     Capillary Refill: Capillary refill takes less than 2 seconds.  Neurological:     General: No focal deficit present.     Mental Status: He is alert.     Sensory: No sensory deficit.     Motor: No weakness.     ED Results / Procedures / Treatments   Labs (all labs ordered are listed, but only abnormal results are displayed) Labs Reviewed - No data to display  EKG None  Radiology DG Foot Complete Right  Result Date: 06/04/2022 CLINICAL DATA:  Laceration over the dorsum of the foot. A knife fell from the stove into the anterior right foot. EXAM: RIGHT FOOT COMPLETE - 3+ VIEW COMPARISON:  None Available. FINDINGS: There is no evidence of fracture or dislocation. There is no evidence of arthropathy or other focal bone abnormality. Soft tissues are unremarkable. No radiopaque foreign body or significant soft tissue gas is present. IMPRESSION: Negative right foot radiographs. Electronically Signed   By: San Morelle  M.D.   On: 06/04/2022 13:53    Procedures .Marland KitchenLaceration Repair  Date/Time: 06/04/2022 2:47 PM  Performed by: Lennice Sites, DO Authorized by: Lennice Sites, DO   Consent:    Consent obtained:  Verbal   Consent given by:  Patient   Risks, benefits, and alternatives were discussed: yes     Risks discussed:  Infection, need for additional repair, nerve damage, pain, poor wound healing, poor cosmetic result, vascular damage, tendon damage and retained foreign body   Alternatives discussed:  No treatment Universal protocol:    Procedure explained and questions answered to patient or proxy's satisfaction: yes     Relevant documents present and verified: yes     Imaging studies available: yes     Patient identity confirmed:  Verbally with patient Anesthesia:    Anesthesia method:  None Laceration details:    Location:  Foot   Foot location:  Top of R foot   Length (cm):  0.5   Depth (mm):  0.1 Pre-procedure details:    Preparation:  Patient was prepped and draped in usual sterile fashion and imaging obtained to evaluate for foreign bodies Exploration:    Wound extent: no areolar tissue violation noted, no fascia violation noted, no foreign bodies/material noted, no muscle damage noted, no nerve damage noted, no underlying fracture noted and no vascular damage noted     Wound extent comment:  Possible ligament/tendon injury?   Contaminated: no   Treatment:    Area cleansed with:  Povidone-iodine, Shur-Clens and saline   Amount of cleaning:  Extensive   Irrigation volume:  1000   Irrigation method:  Pressure wash   Visualized foreign bodies/material removed: no   Skin repair:    Repair method:  Steri-Strips and tissue adhesive   Number of Steri-Strips:  3 Approximation:    Approximation:  Close Repair type:    Repair type:  Simple Post-procedure details:    Dressing:  Non-adherent dressing   Procedure completion:  Tolerated     Medications Ordered in ED Medications   lidocaine-EPINEPHrine (XYLOCAINE W/EPI) 2 %-1:200000 (PF) injection (  Not Given 06/04/22 1444)  cephALEXin (KEFLEX) capsule 500 mg (has no administration in time range)    ED Course/ Medical Decision Making/ A&P                           Medical Decision Making Amount and/or Complexity of Data Reviewed Radiology: ordered.  Risk Prescription drug management.   Bebe Liter is here with right foot injury.  Normal vitals.  No fever.  No significant medical history.  Tetanus shot not sure if it is up-to-date.  Patient dropped a kitchen knife on his right foot.  He appears to have pierced his top of his right foot.  He appears to maybe have decreased extension at the right pinky toe although really hard to isolate.  Wound is very superficial.  Does not go deep into the foot or soft tissues.  Wound is hemostatic.  We will get x-ray.  Neurovascular neuromuscular intact otherwise.  Per radiology report there is no fracture.  Talked with Dr. Marcelino Scot with orthopedic as concern for may be ligament/tendon injury of the extensor region of the right pinky toe.  At this time Dr. Marcelino Scot okay with washing out the wound and closed wound superficial wound with Dermabond and Steri-Strips.  Will place in a postop shoe and have patient follow-up closely outpatient.  Seems like maybe he is getting some better function in the toe on reevaluation.  Possibly this could just be secondary to pain, inflammation.  But understands wound care instructions.  Understands orthopedic instructions and follow-up.  Discharged in good condition.  This chart was dictated using voice recognition software.  Despite best efforts to proofread,  errors can occur which can change the documentation meaning.         Final Clinical Impression(s) / ED Diagnoses Final diagnoses:  Laceration of right foot, initial encounter    Rx /  DC Orders ED Discharge Orders          Ordered    cephALEXin (KEFLEX) 500 MG capsule  2 times  daily        06/04/22 [place], [place], DO 06/04/22 1449

## 2022-06-04 NOTE — Discharge Instructions (Signed)
Please keep your right foot clean and dry.  Keep postop shoe on while walking.  Would recommend knee dressing daily.  Take next dose antibiotic tomorrow.  If you have any signs of infection please return for evaluation.  Follow-up with orthopedics otherwise.

## 2022-06-06 ENCOUNTER — Telehealth: Payer: Self-pay | Admitting: *Deleted

## 2022-06-06 NOTE — Telephone Encounter (Signed)
   Name: Christopher Collins  DOB: Jul 26, 1966  MRN: [id number]   Primary Cardiologist: None  Chart reviewed as part of pre-operative protocol coverage. Patient was contacted 06/06/2022 in reference to pre-operative risk assessment for pending surgery as outlined below.  Christopher Collins was last seen on 05/20/2022 by Dr. Percival Spanish.  Since that day, Christopher Collins has done well from a cardiac standpoint.  He denies any new symptoms or concerns.  He is able to complete > 4 METS without difficulty.   Therefore, based on ACC/AHA guidelines, the patient would be at acceptable risk for the planned procedure without further cardiovascular testing.   The patient was advised that if he develops new symptoms prior to surgery to contact our office to arrange for a follow-up visit, and he verbalized understanding.  Per office protocol, he may hold aspirin for 5 to 7 days prior to procedure.  Please resume aspirin as soon as possible postprocedure, the discretion of the surgeon.  I will route this recommendation to the requesting party via Epic fax function and remove from pre-op pool. Please call with questions.  Lenna Sciara, NP 06/06/2022, 4:50 PM

## 2022-06-06 NOTE — Telephone Encounter (Signed)
   Pre-operative Risk Assessment    Patient Name: Christopher Collins  DOB: May 13, 1966 MRN: [id number]      Request for Surgical Clearance    Procedure:   RIGHT FOOT WOUND IRRIGATION AND DEBRIDEMENT; REPAIR OF TENDON AS NEEDED  Date of Surgery:  Clearance 06/07/22 URGENT                              Surgeon:  DR. Jenny Reichmann HEWITT Surgeon's Group or Practice Name:  [facility] Phone number:  [phone] Fax number:  [phone]   Type of Clearance Requested:   - Medical ; ASA; MED LIST IN CHART NOTES ASA IS A;READY ON HOLD   Type of Anesthesia:   CHOICE   Additional requests/questions:    Jiles Prows   06/06/2022, 4:42 PM

## 2022-06-10 ENCOUNTER — Other Ambulatory Visit: Payer: BC Managed Care – PPO

## 2022-06-30 ENCOUNTER — Ambulatory Visit: Payer: BC Managed Care – PPO | Admitting: Sports Medicine

## 2022-06-30 VITALS — BP 136/80 | Ht 72.0 in | Wt 228.0 lb

## 2022-06-30 DIAGNOSIS — M25562 Pain in left knee: Secondary | ICD-10-CM

## 2022-06-30 NOTE — Patient Instructions (Signed)
It was wonderful to meet you today. Thank you for allowing me to be a part of your care. Below is a short summary of what we discussed at your visit today:  Your knee pain is likely due to a mild meniscus contusion.  I recommend continued use of your body helix knee brace.  We have provided you with isometric quad strengthening exercises, please do this daily.  Follow-up with [place] in 3 weeks for reevaluation.  Please bring all of your medications to every appointment!  If you have any questions or concerns, please do not hesitate to contact us via phone or MyChart message.   Alen Bleacher, MD [facility]

## 2022-06-30 NOTE — Progress Notes (Signed)
    SUBJECTIVE:   CHIEF COMPLAINT / HPI:   Patient is a 56 year old male who presents today for left knee pain that started 2 days ago.  He describes his pain as a sharp and aching pain.  Currently he is on a right ankle boots and so has been favoring his left leg in the last couple of weeks.  He reports pain started after he tried to get up from a sitting position in which he slightly rotated his knee.  Initially noticed mild swelling of the left knee and pain is usually localized to the medial aspect of the knee.  He denies any catching or locking of his left knee joint. Unsure about buckling but has been very careful with range of motion on the left knee.  Pain is not present at rest but mostly with change in position or movement.  He has only tried icing the knee which provided mild relief.  Of note he has had chronic left knee pain for years and x-ray 5 years ago was unremarkable.  PERTINENT  PMH / PSH: Reviewed  OBJECTIVE:   BP 136/80   Ht 6' (1.829 m)   Wt 228 lb (103.4 kg)   BMI 30.92 kg/m    Physical Exam  General:  Alert, well appearing, NAD  Left Knee Exam No effusion.  No other gross deformity, ecchymoses. TTP medial joint line. FROM with normal strength. Negative ant/post drawers. Negative valgus/varus testing. Negative lachman. Negative mcmurrays, Thessalys. NV intact distally.    ASSESSMENT/PLAN:   Left knee pain is likely secondary to meniscus contusion. On exam have good stability of his knee.  Recommend continued use of his body helix brace.  Provided patient with isometric quad strengthening exercises which he will do daily.  Plan to follow-up in 3 weeks for reevaluation.   Alen Bleacher, MD [facility]   Patient seen and evaluated with the resident.  I agree with the above plan of care.  Patient has a history of chronic left knee pain.  MRI done several years ago was unremarkable.  His current injury is 48 days old.  He has no swelling  on exam.  Knee is stable to ligamentous exam.  I recommended a watchful waiting approach for the next 3 weeks.  I recommend a compression sleeve of some sort as well as isometric quad exercises.  If symptoms persist at follow-up then we could consider merits of imaging at that time.  This note was dictated using Dragon naturally speaking software and may contain errors in syntax, spelling, or content which have not been identified prior to signing this note.

## 2022-07-21 ENCOUNTER — Ambulatory Visit: Payer: BC Managed Care – PPO | Admitting: Sports Medicine

## 2022-07-28 ENCOUNTER — Ambulatory Visit: Payer: BC Managed Care – PPO | Admitting: Sports Medicine

## 2022-07-28 VITALS — BP 122/80 | Ht 72.5 in | Wt 230.0 lb

## 2022-07-28 DIAGNOSIS — M25562 Pain in left knee: Secondary | ICD-10-CM | POA: Diagnosis not present

## 2022-07-28 NOTE — Progress Notes (Signed)
  SUBJECTIVE:   CHIEF COMPLAINT / HPI:   Left knee pain: 56 year old male following up for left knee pain suspected to be related to meniscus contusion.  He has been performing isometric quad strengthening exercises and using a compression sleeve.  He also notes that he put lifts on his sofa so there is not as much stress on his knees.  States that his pain is 90% improved.  PERTINENT  PMH / PSH: Reviewed  OBJECTIVE:  BP 122/80   Ht 6' 0.5" (1.842 m)   Wt 230 lb (104.3 kg)   BMI 30.76 kg/m  Left knee: No gross deformity, ecchymoses, swelling. No TTP. FROM with normal strength. Negative ant/post drawers. Negative valgus/varus testing. Negative lachman. Negative mcmurrays.  ASSESSMENT/PLAN:  His left knee pain has greatly improved.  No need for further imaging at this time.  May continue strengthening exercises and brace based on comfort level.  Return for follow-up as needed.  Wells Guiles, DO 07/28/2022, 11:03 AM PGY-2, [facility]  Patient seen and evaluated with the resident.  I agree with the above plan of care.  Patient is doing very well so no need for further work-up or treatment at this time.  Follow-up as needed.  This note was dictated using Dragon naturally speaking software and may contain errors in syntax, spelling, or content which have not been identified prior to signing this note.

## 2022-08-09 ENCOUNTER — Encounter: Payer: Self-pay | Admitting: Family Medicine

## 2022-08-09 ENCOUNTER — Other Ambulatory Visit: Payer: Self-pay | Admitting: Family Medicine

## 2022-08-09 ENCOUNTER — Ambulatory Visit
Admission: RE | Admit: 2022-08-09 | Discharge: 2022-08-09 | Disposition: A | Discharge status: Home / Self Care | Payer: BC Managed Care – PPO | Source: Ambulatory Visit | Attending: Family Medicine | Admitting: Family Medicine

## 2022-08-09 DIAGNOSIS — R200 Anesthesia of skin: Secondary | ICD-10-CM

## 2022-08-09 DIAGNOSIS — M542 Cervicalgia: Secondary | ICD-10-CM

## 2022-09-08 ENCOUNTER — Other Ambulatory Visit (HOSPITAL_COMMUNITY): Payer: Self-pay | Admitting: Family Medicine

## 2022-09-08 DIAGNOSIS — N50811 Right testicular pain: Secondary | ICD-10-CM

## 2022-09-09 ENCOUNTER — Ambulatory Visit (HOSPITAL_COMMUNITY)
Admission: RE | Admit: 2022-09-09 | Discharge: 2022-09-09 | Disposition: A | Discharge status: Home / Self Care | Payer: BC Managed Care – PPO | Source: Ambulatory Visit | Attending: Family Medicine | Admitting: Family Medicine

## 2022-09-09 DIAGNOSIS — N50811 Right testicular pain: Secondary | ICD-10-CM | POA: Diagnosis present

## 2022-09-10 ENCOUNTER — Encounter (HOSPITAL_COMMUNITY): Payer: Self-pay

## 2022-09-10 ENCOUNTER — Ambulatory Visit (HOSPITAL_COMMUNITY): Payer: BC Managed Care – PPO

## 2022-09-14 ENCOUNTER — Ambulatory Visit: Payer: BC Managed Care – PPO | Admitting: Podiatry

## 2022-09-14 VITALS — BP 128/74

## 2022-09-14 DIAGNOSIS — B351 Tinea unguium: Secondary | ICD-10-CM

## 2022-09-14 NOTE — Progress Notes (Signed)
Subjective:  Patient ID: Christopher Collins, male    DOB: 04-Feb-1966,  MRN: [id number]  Chief Complaint  Patient presents with   Nail Problem    57 y.o. male presents with the above complaint.  Patient presents for follow-up of bilateral hallux and second digit onychomycosis.  He states he may need to laser again.  He started notice some of the nail fungus come back.   Review of Systems: Negative except as noted in the HPI. Denies N/V/F/Ch.  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 x 2 yr.   Mucoid cyst of joint 03/2013   right middle finger   Sleep apnea with use of continuous positive airway pressure (CPAP)     Current Outpatient Medications:    acetaminophen (TYLENOL) 325 MG tablet, Take 2 tablets (650 mg total) by mouth every 6 (six) hours as needed for mild pain or moderate pain., Disp: , Rfl:    Alpha-Lipoic Acid 300 MG TABS, Take 300 mg by mouth at bedtime., Disp: , Rfl:    ALPRAZolam (XANAX) 0.25 MG tablet, Take 0.125-0.25 mg by mouth at bedtime as needed for anxiety or sleep., Disp: , Rfl:    amLODipine (NORVASC) 10 MG tablet, Take 10 mg by mouth at bedtime., Disp: , Rfl:    aspirin EC 81 MG tablet, Take 1 tablet (81 mg total) by mouth daily., Disp: 90 tablet, Rfl: 3   cephALEXin (KEFLEX) 500 MG capsule, Take 1 capsule (500 mg total) by mouth 3 (three) times daily., Disp: 30 capsule, Rfl: 0   Cholecalciferol (VITAMIN D-3) 125 MCG (5000 UT) TABS, Take 5,000 Units by mouth every other day., Disp: , Rfl:    GINKGO BILOBA PO, Take 1 tablet by mouth every morning. "[id number]", Disp: , Rfl:    ibuprofen (ADVIL) 200 MG tablet, Take 400 mg by mouth every 4 (four) hours as needed (pain)., Disp: , Rfl:     lamoTRIgine (LAMICTAL) 150 MG tablet, Take 150 mg by mouth every morning., Disp: , Rfl:    lisinopril (PRINIVIL,ZESTRIL) 10 MG tablet, Take 10 mg by mouth at bedtime., Disp: , Rfl:    MAGNESIUM PO, Take 1 capsule by mouth at bedtime., Disp: , Rfl:    mirtazapine (REMERON) 15 MG tablet, Take 15 mg by mouth at bedtime. Take with a 45 mg tablet for a total nightly dose of 60 mg, Disp: , Rfl:    mirtazapine (REMERON) 45 MG tablet, Take 45 mg by mouth at bedtime. Take with a 15 mg tablet for a total nightly dose of 60 mg, Disp: , Rfl:    mometasone (NASONEX) 50 MCG/ACT nasal spray, Place 2 sprays into the nose daily as needed (seasonal allergies)., Disp: , Rfl:    montelukast (SINGULAIR) 10 MG tablet, Take 1 tablet (10 mg total) by mouth daily. (Patient taking differently: Take 10 mg by mouth at bedtime.), Disp: 30 tablet, Rfl: 0   Multiple Vitamins-Minerals (ZINC PO), Take 1 capsule by mouth at bedtime., Disp: , Rfl:    nitroGLYCERIN (NITROSTAT) 0.4 MG SL tablet, Place 1 tablet (0.4 mg total) under the tongue every 5 (five) minutes as needed  for chest pain., Disp: 25 tablet, Rfl: 3   ondansetron (ZOFRAN) 4 MG tablet, Take 1 tablet (4 mg total) by mouth daily as needed for nausea or vomiting., Disp: 30 tablet, Rfl: 1   PREVIDENT 5000 ENAMEL PROTECT 1.1-5 % GEL, Place 1 application onto teeth 2 (two) times daily., Disp: , Rfl:    rosuvastatin (CRESTOR) 10 MG tablet, Take 10 mg by mouth every morning. Take with a 5 mg tablet for a total daily dose of 15 mg, Disp: , Rfl:    rosuvastatin (CRESTOR) 5 MG tablet, Take 5 mg by mouth every morning. Take with a 10 mg tablet for a total daily dose of 15 mg, Disp: , Rfl:    Sodium Chloride Flush (NORMAL SALINE FLUSH) 0.9 % SOLN, Flush drainage catheter 1-2 times daily with 5-10 mL., Disp: 300 mL, Rfl: 0   SYSTANE 0.4-0.3 % SOLN, Place 1 drop into both eyes every 2 (two) hours., Disp: , Rfl:   Social History   Tobacco Use  Smoking Status Never   Passive exposure:  Past  Smokeless Tobacco Never    Allergies  Allergen Reactions   Nuvigil [Armodafinil] Other (See Comments)    Anxiety and tinnitis 2014 after taking just 2 tabs.   Smallpox Virus Vaccine Live Other (See Comments)    Made patient sick with small pox symptoms   Doxycycline Other (See Comments)     Increases tinnitis   Objective:  There were no vitals filed for this visit. There is no height or weight on file to calculate BMI. Constitutional Well developed. Well nourished.  Vascular Dorsalis pedis pulses palpable bilaterally. Posterior tibial pulses palpable bilaterally. Capillary refill normal to all digits.  No cyanosis or clubbing noted. Pedal hair growth normal.  Neurologic Normal speech. Oriented to person, place, and time. Epicritic sensation to light touch grossly present bilaterally.  Dermatologic Regressing discolored thickened dystrophic nail noted to the right hallux/bilateral hallux and second digit.  Orthopedic: Normal joint ROM without pain or crepitus bilaterally. No visible deformities. No bony tenderness.   Radiographs: None Assessment:   1. Onychomycosis due to dermatophyte   2. Nail fungus     Plan:  Patient was evaluated and treated and all questions answered.  Bilateral hallux and second digit onychomycosis -Patient is nail has regressed slightly -Patient is noticing some recurrence of nail fungus.  Therefore I instructed him to make an appointment for laser for bilateral hallux and second digit -He will schedule an appointment for laser    No follow-ups on file.

## 2022-09-19 ENCOUNTER — Other Ambulatory Visit: Payer: BC Managed Care – PPO

## 2022-09-20 ENCOUNTER — Other Ambulatory Visit: Payer: BC Managed Care – PPO

## 2022-09-20 ENCOUNTER — Ambulatory Visit: Payer: BC Managed Care – PPO | Admitting: Podiatry

## 2022-09-20 ENCOUNTER — Ambulatory Visit (INDEPENDENT_AMBULATORY_CARE_PROVIDER_SITE_OTHER): Payer: BC Managed Care – PPO

## 2022-09-20 DIAGNOSIS — B351 Tinea unguium: Secondary | ICD-10-CM

## 2022-09-20 NOTE — Progress Notes (Signed)
Patient presents today for the 12th laser treatment for the left hallux. Diagnosed with mycotic nail infection by Dr. Posey Pronto.    Toenail most affected nail is the hallux bilateral. Lateral borders of both hallux still have room for improvement.    All other systems are negative.   Nails were filed thin. Laser therapy was administered to 1st toenails bilateral and patient tolerated the treatment well. All safety precautions were in place.    Patient would like to complete a few more laser treatment to ensure the fungus is completely gone.   Will follow up in 6 weeks for laser #13.

## 2022-11-01 ENCOUNTER — Ambulatory Visit (INDEPENDENT_AMBULATORY_CARE_PROVIDER_SITE_OTHER): Payer: BC Managed Care – PPO

## 2022-11-01 DIAGNOSIS — B351 Tinea unguium: Secondary | ICD-10-CM

## 2022-11-01 NOTE — Progress Notes (Signed)
Patient presents today for the 13th laser treatment for the left hallux. Diagnosed with mycotic nail infection by Dr. Posey Pronto.    Toenail most affected nail is the hallux bilateral. Lateral borders of both hallux still have room for improvement.    All other systems are negative.   Nails were filed thin. Laser therapy was administered to 1st toenails bilateral and patient tolerated the treatment well. All safety precautions were in place.    Patient would like to complete a few more laser treatment to ensure the fungus is completely gone.   Will follow up in 6 weeks for laser #14.

## 2022-11-25 LAB — LAB REPORT - SCANNED: EGFR: 98

## 2022-12-07 ENCOUNTER — Ambulatory Visit: Payer: BC Managed Care – PPO | Admitting: Podiatry

## 2022-12-07 DIAGNOSIS — E119 Type 2 diabetes mellitus without complications: Secondary | ICD-10-CM | POA: Diagnosis not present

## 2022-12-07 NOTE — Progress Notes (Signed)
   Chief Complaint  Patient presents with   Ingrown Toenail    Patient came in in today for ingrown toenail and nail fungus,right hallux nail, started 2 today ago, patient denies any pain,     Subjective: Patient presents today for evaluation of pain to the medial border of the right great toe. Patient is concerned for possible ingrown nail.  It is very sensitive to touch.  Patient has a history of partial nail matricectomy to this area in the past but he says that he believes the nail plate has grown back.  Unfortunately he may be traveling over the next few weeks to visit his mother who is in hospice care so he is not interested at the moment in aggressive repeat nail matricectomy but looking for more conservative trimming of the nail and debridement of the ingrown portion.  Patient presents today for further treatment and evaluation.  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 x 2 yr.   Mucoid cyst of joint 03/2013   right middle finger   Sleep apnea with use of continuous positive airway pressure (CPAP)     Objective:  General: Well developed, nourished, in no acute distress, alert and oriented x3   Dermatology: Skin is warm, dry and supple bilateral.  Medial border right great toe is tender with evidence of an ingrowing nail. Pain on palpation noted to the border of the nail fold. The remaining nails appear unremarkable at this time. There are no open sores, lesions.  Vascular: DP and PT pulses palpable.  No clinical evidence of vascular compromise  Neruologic: Grossly intact via light touch bilateral.  Musculoskeletal: No pedal deformity noted  Assesement: #1 Paronychia with ingrowing nail medial border right  great toe #2 encounter for diabetic foot exam  Plan of Care:  1. Patient evaluated.  Comprehensive diabetic foot exam performed today 2. Discussed treatment alternatives and plan of care. Explained nail avulsion procedure and post procedure course to patient. 3.  For now mechanical debridement of the ingrown portion of the nail plate was performed today using a nail nipper without incident or bleeding.  The patient did feel some moderate relief 4. At one point I do recommend repeat partial nail matricectomy.  He understands and will have this performed when he is back in town. 5.  Return to clinic as needed  Edrick Kins, DPM [facility]  Dr. Edrick Kins, DPM    [address], [place] [address]                Office [phone]  Fax [phone]

## 2022-12-08 ENCOUNTER — Encounter: Payer: Self-pay | Admitting: Podiatry

## 2022-12-08 ENCOUNTER — Ambulatory Visit: Payer: BC Managed Care – PPO | Admitting: Podiatry

## 2022-12-08 DIAGNOSIS — L600 Ingrowing nail: Secondary | ICD-10-CM

## 2022-12-08 MED ORDER — CEPHALEXIN 500 MG PO CAPS: 500.0000 mg | ORAL_CAPSULE | Freq: Three times a day (TID) | ORAL | 0 refills | Status: AC

## 2022-12-08 NOTE — Patient Instructions (Signed)

## 2022-12-08 NOTE — Addendum Note (Signed)
Addended by: Ames Coupe F on: 12/08/2022 11:05 AM   Modules accepted: Orders

## 2022-12-08 NOTE — Progress Notes (Signed)
Subjective:  Patient ID: Christopher Collins, male    DOB: 04/10/1966,  MRN: [id number]  Chief Complaint  Patient presents with   Ingrown Toenail    Hallux right - medial border - ingrown toenail, patient was here yesterday, was thinking he might have to go out of town to [place] for his sick mom, but toe is bothering him so much wanted to go ahead and get ingrown procedure done    57 y.o. male presents with concern for pain on the ingrown border of the right great toenail.  He was previously seen by Dr. Amalia Hailey yesterday.  Did not procedure at that time so aggressive slant back was performed with some relief however he is concerned that there is still pain he is hoping to have an avulsion done today.  Does not want a chemical but as he has had that done in the past and cause more burning pain and longer recovery than he is wanting to do at this time.  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 x 2 yr.   Mucoid cyst of joint 03/2013   right middle finger   Sleep apnea with use of continuous positive airway pressure (CPAP)     Allergies  Allergen Reactions   Nuvigil [Armodafinil] Other (See Comments)    Anxiety and tinnitis 2014 after taking just 2 tabs.   Smallpox Virus Vaccine Live Other (See Comments)    Made patient sick with small pox symptoms   Doxycycline Other (See Comments)     Increases tinnitis    ROS: Negative except as per HPI above  Objective:  General: AAO x3, NAD  Dermatological: Incurvation is present along the medial nail border of the right great toe. There is localized edema without any erythema or increase in warmth around the nail border. There is no drainage or pus. There is no ascending  cellulitis. No malodor. No open lesions or pre-ulcerative lesions.    Vascular:  Dorsalis Pedis artery and Posterior Tibial artery pedal pulses are 2/4 bilateral.  Capillary fill time < 3 sec to all digits.   Neruologic: Grossly intact via light touch bilateral. Protective threshold intact to all sites bilateral.   Musculoskeletal: No gross boney pedal deformities bilateral. No pain, crepitus, or limitation noted with foot and ankle range of motion bilateral. Muscular strength 5/5 in all groups tested bilateral.  Gait: Unassisted, Nonantalgic.   No images are attached to the encounter.  Assessment:   1. Ingrown nail of great toe of right foot      Plan:  Patient was evaluated and treated and all questions answered.    Ingrown Nail, right - avulsion only med border -Patient elects to proceed with minor surgery to remove ingrown toenail today. Consent reviewed and signed by patient. -Ingrown nail excised. See procedure note. -Educated on post-procedure care including soaking. Written instructions provided and reviewed. -Patient to follow up in 2 weeks for nail check.  Procedure: Excision of Ingrown Toenail Location: Right 1st toe medial nail borders. Anesthesia: Lidocaine 1% plain; 1.5 mL and Marcaine 0.5% plain; 1.5 mL, digital block. Skin Prep: Betadine. Dressing: Silvadene; telfa;  dry, sterile, compression dressing. Technique: Following skin prep, the toe was exsanguinated and a tourniquet was secured at the base of the toe. The affected nail border was freed, split with a nail splitter, and excised. The nail bed was irrigated out with alcohol. The tourniquet was then removed and sterile dressing applied. Disposition: Patient tolerated procedure well. Patient to return in 2 weeks for follow-up.    Return if symptoms worsen or fail to improve.          Everitt Amber, DPM [facility]

## 2022-12-13 ENCOUNTER — Ambulatory Visit (INDEPENDENT_AMBULATORY_CARE_PROVIDER_SITE_OTHER): Payer: BC Managed Care – PPO

## 2022-12-13 DIAGNOSIS — B351 Tinea unguium: Secondary | ICD-10-CM | POA: Insufficient documentation

## 2022-12-13 NOTE — Progress Notes (Signed)
Patient presents today for the 14th laser treatment for the left hallux. Diagnosed with mycotic nail infection by Dr. Posey Pronto.    Toenail most affected nail is the left  hallux. Lateral borders of left hallux still have room for improvement.   We avoided treatment on right hallux due to recent ingrown removal 1 week ago.    All other systems are negative.   Nails were filed thin. Laser therapy was administered to 1st left toenail and patient tolerated the treatment well. All safety precautions were in place.    Patient would like to complete a few more laser treatment to ensure the fungus is completely gone.   Will follow up in 4 weeks for laser #15.

## 2023-01-10 ENCOUNTER — Encounter: Payer: Self-pay | Admitting: *Deleted

## 2023-01-13 ENCOUNTER — Ambulatory Visit (INDEPENDENT_AMBULATORY_CARE_PROVIDER_SITE_OTHER): Payer: BC Managed Care – PPO

## 2023-01-13 DIAGNOSIS — B351 Tinea unguium: Secondary | ICD-10-CM

## 2023-01-13 NOTE — Progress Notes (Signed)
Patient presents today for the 15th laser treatment for the left hallux. Diagnosed with mycotic nail infection by Dr. Allena Katz.    Toenail most affected nail is the left  hallux. Lateral borders of left hallux still have room for improvement.   We avoided treatment on right hallux due to recent ingrown removal 1 week ago.    All other systems are negative.   Nails were filed thin. Laser therapy was administered to 1st left toenail and patient tolerated the treatment well. All safety precautions were in place.    Patient would like to complete a few more laser treatment to ensure the fungus is completely gone.   Will follow up in 6 weeks for laser #16.

## 2023-01-19 ENCOUNTER — Encounter: Payer: Self-pay | Admitting: Family Medicine

## 2023-02-24 ENCOUNTER — Ambulatory Visit (INDEPENDENT_AMBULATORY_CARE_PROVIDER_SITE_OTHER): Payer: BC Managed Care – PPO

## 2023-02-24 DIAGNOSIS — B351 Tinea unguium: Secondary | ICD-10-CM

## 2023-02-24 NOTE — Progress Notes (Signed)
Patient presents today for the 15th laser treatment for the left hallux. Diagnosed with mycotic nail infection by Dr. Patel.    Toenail most affected nail is the left  hallux. Lateral borders of left hallux still have room for improvement.   We avoided treatment on right hallux due to recent ingrown removal 1 week ago.    All other systems are negative.   Nails were filed thin. Laser therapy was administered to 1st left toenail and patient tolerated the treatment well. All safety precautions were in place.    Patient would like to complete a few more laser treatment to ensure the fungus is completely gone.   Will follow up in 6 weeks for laser #16.  

## 2023-03-07 IMAGING — US US ABDOMEN LIMITED
1 series · 14 of 25 positions shown · non-contrast
Comparison: Abdominal ultrasound 08/24/2015

CLINICAL DATA: Elevated liver enzymes.

EXAM:
ULTRASOUND ABDOMEN LIMITED RIGHT UPPER QUADRANT

[Series 1: us abdomen limited · 0.25mm/px · 14 of 38 slices shown]
[im 1/38]
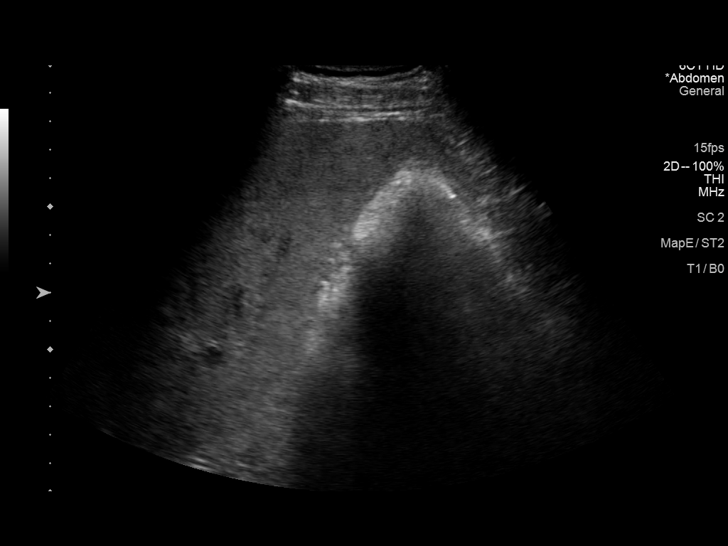
[im 4/38]
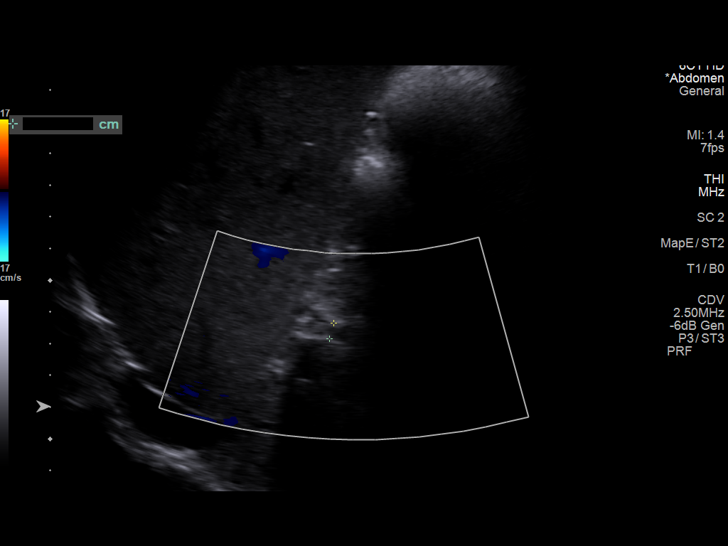
[im 7/38]
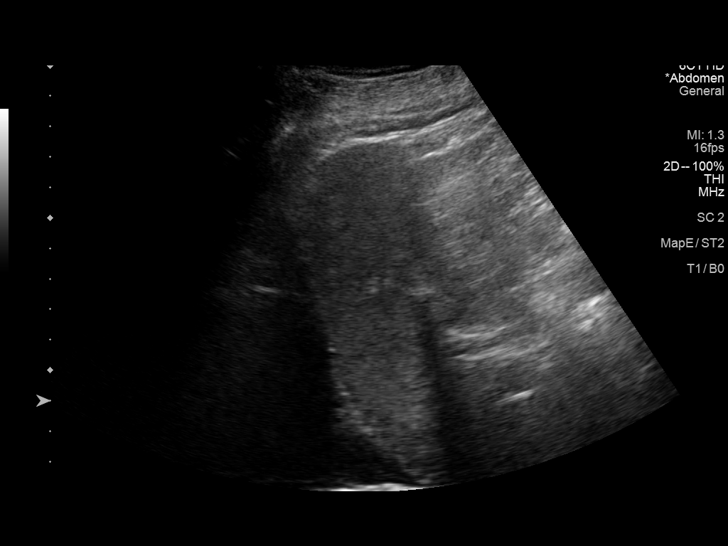
[im 10/38]
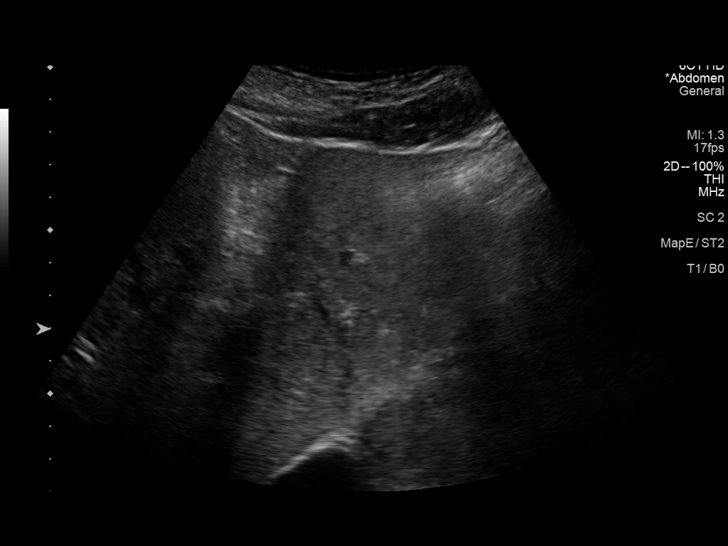
[im 13/38]
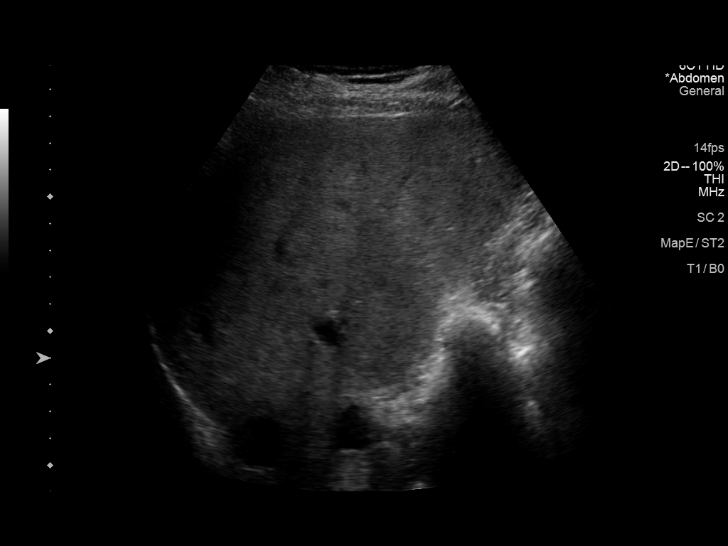
[im 14/38]
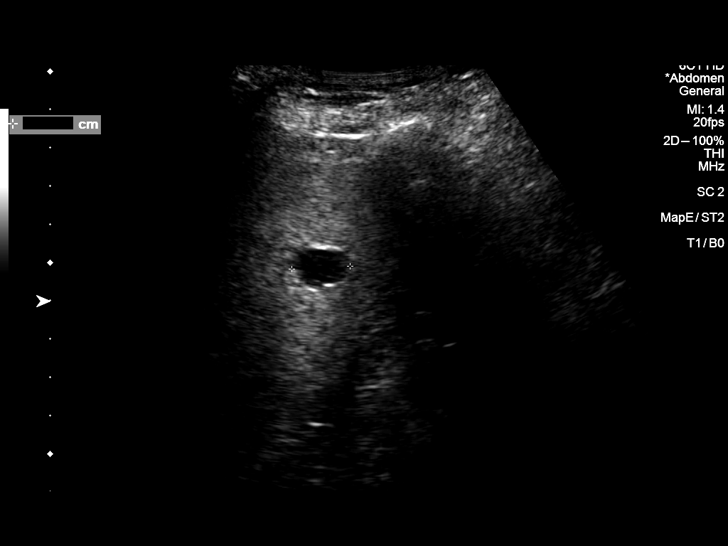
[im 17/38]
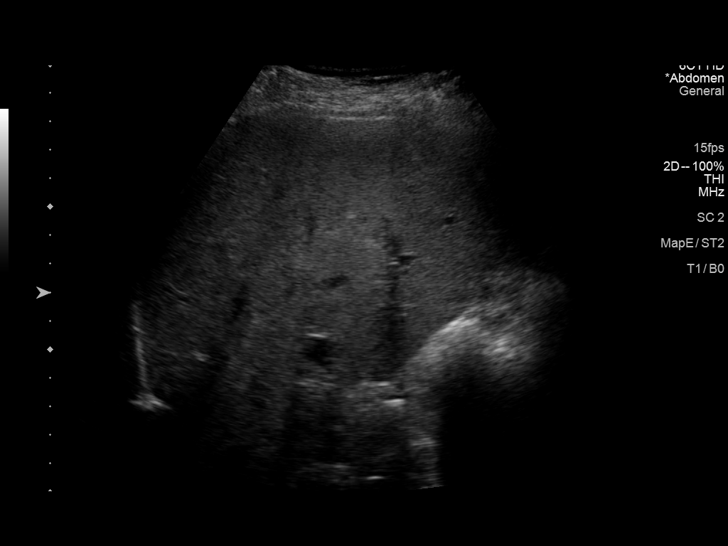
[im 21/38]
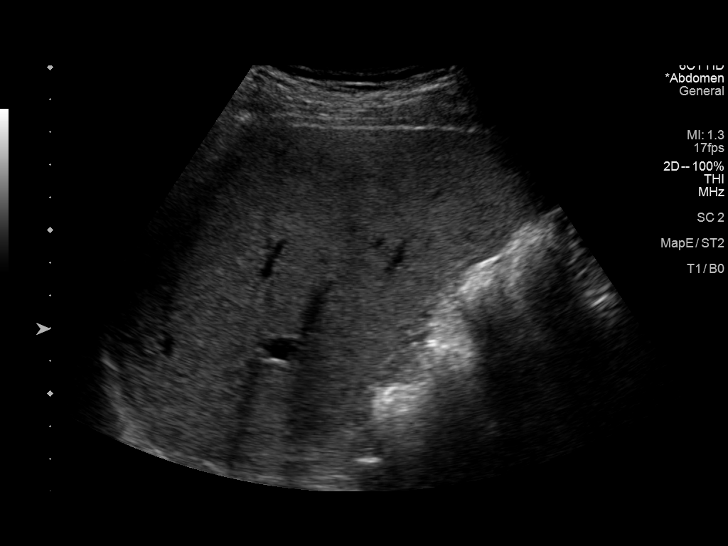
[im 24/38]
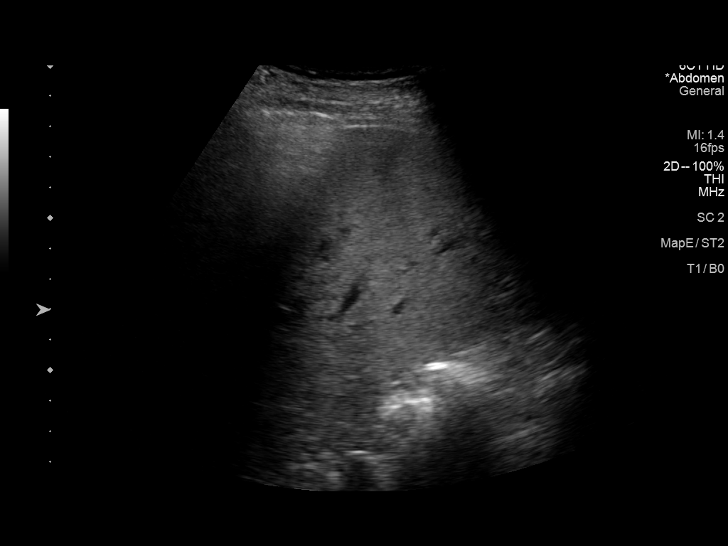
[im 25/38]
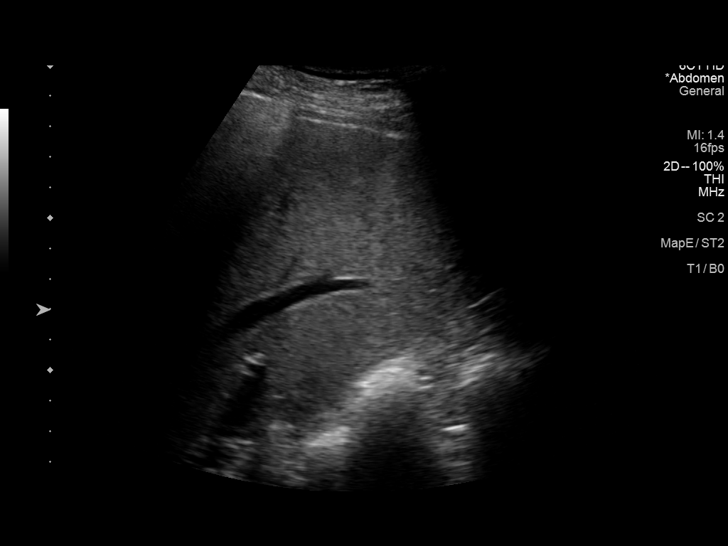
[im 28/38]
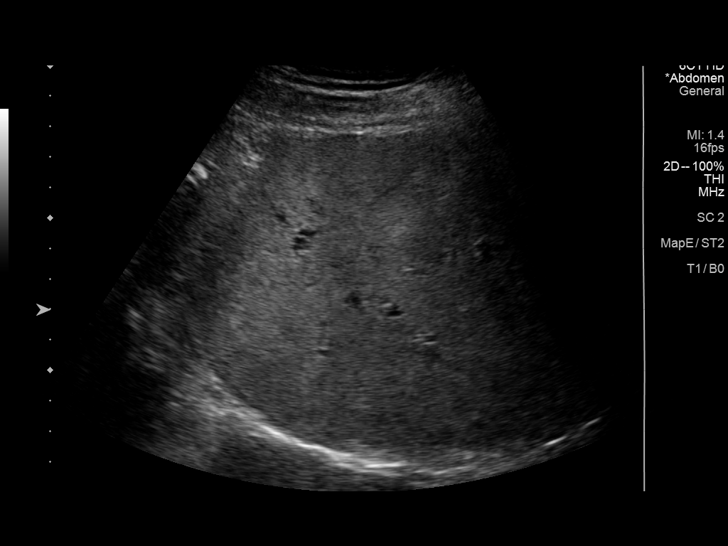
[im 31/38]
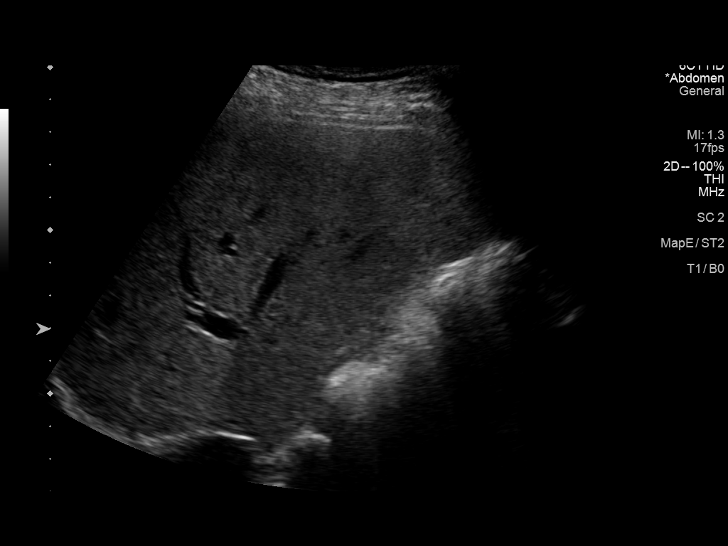
[im 34/38]
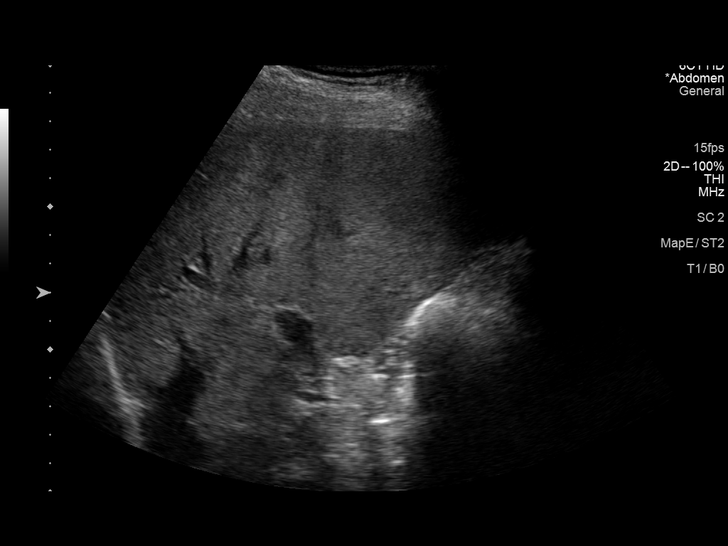
[im 38/38]
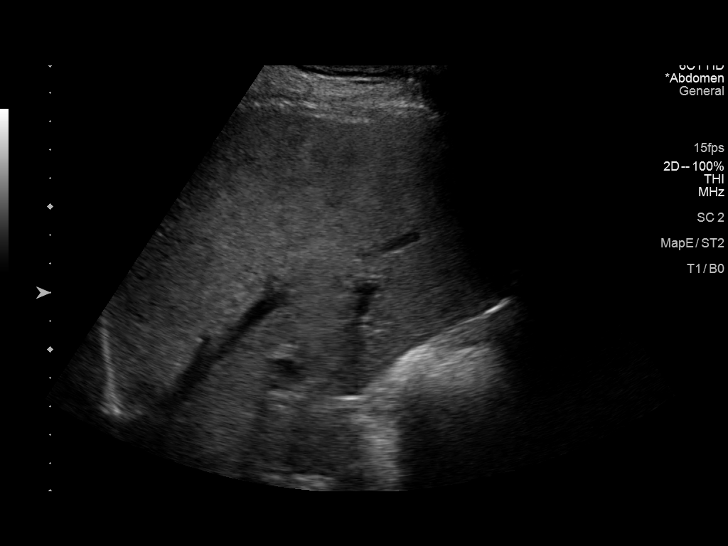

[14 of 25 positions shown; findings below may reference images not displayed]

FINDINGS: Gallbladder:

Surgically absent.

Common bile duct:

Diameter: 5 mm, normal.

Liver:

Heterogeneous increased parenchymal echogenicity. The liver
parenchyma is difficult to penetrate. There is no definite capsular
nodularity. Cyst in the right hepatic lobe measures 1.5 x 1.3 x
cm. There is no additional right lobe cyst measuring 1.2 x 1.3 x
cm. No evidence of solid lesion. Portal vein is patent on color
Doppler imaging with normal direction of blood flow towards the
liver.

Other: No right upper quadrant ascites.
IMPRESSION: 1. Heterogeneous increased parenchymal echogenicity typical of
steatosis. This is similar in appearance to prior exam.
2. Two simple cysts in the right lobe, larger measuring 1.7 cm. This
larger cyst was seen on prior exam, not significantly changed
allowing for differences in caliper placement. The additional cyst
was not seen on prior.
3. Post cholecystectomy without biliary dilatation.

## 2023-04-21 ENCOUNTER — Ambulatory Visit (INDEPENDENT_AMBULATORY_CARE_PROVIDER_SITE_OTHER): Payer: BC Managed Care – PPO | Admitting: Podiatry

## 2023-04-21 DIAGNOSIS — B351 Tinea unguium: Secondary | ICD-10-CM

## 2023-04-21 NOTE — Progress Notes (Signed)
Patient presents today for laser treatment #16.  The additional treatment appointments were requested by patient.  The fungal involvement includes the bilateral hallux toenails.  The patient had a recent right hallux and ingrown nail removal prior to his last treatment so the right hallux nail was not treated with laser on last visit.  Patient was last seen by Dr. Annamary Rummage for fungal nail evaluation/ingrown nail care.  Reviewed laser safety/laser goggles were dispensed patient  The bilateral hallux nails were debrided with sterile nail nippers and a power bleeding bur uneventfully.  The laser pinpoint system was utilized to perform 2 passes of the laser treatment to both hallux nails.  Follow-up in 6 weeks for laser treatment #17.

## 2023-06-09 ENCOUNTER — Ambulatory Visit (INDEPENDENT_AMBULATORY_CARE_PROVIDER_SITE_OTHER): Payer: BC Managed Care – PPO | Admitting: *Deleted

## 2023-06-09 DIAGNOSIS — B351 Tinea unguium: Secondary | ICD-10-CM

## 2023-06-09 NOTE — Progress Notes (Signed)
Patient presents today for laser maintenance. Diagnosed with mycotic nail infection by Dr. Allena Katz.    Toenail most affected nail is the right hallux. Lateral border of left hallux still has room for improvement.     All other systems are negative.   Nails were filed thin. Laser therapy was administered to 1-5 bilateral toenail and patient tolerated the treatment well. All safety precautions were in place.    Single pass done on unaffected toenails.  Patient will continue treatments every 6 weeks for maintenance.

## 2023-07-25 IMAGING — DX DG CHEST 1V PORT
1 series · 1 of 1 positions shown · non-contrast
Comparison: Chest x-ray 08/27/2021

CLINICAL DATA: Cough

EXAM:
PORTABLE CHEST 1 VIEW

[chest ap]
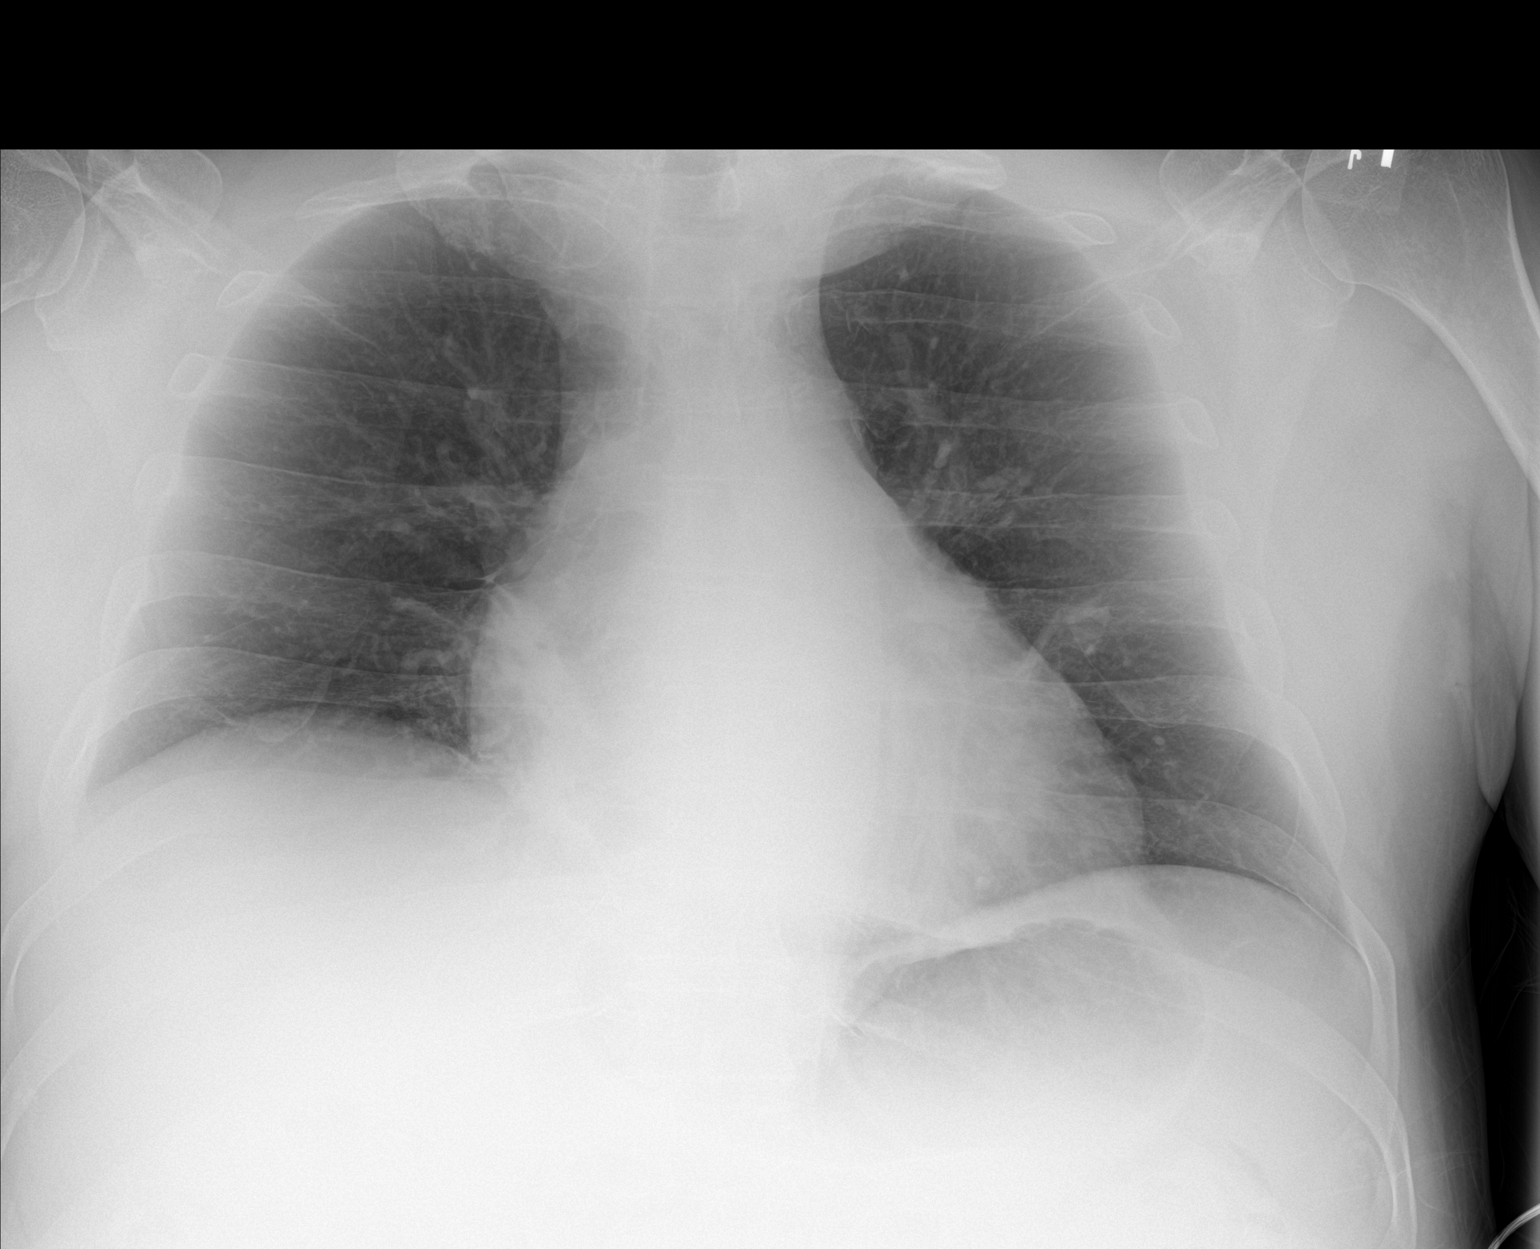

[1 of 1 positions shown; findings below may reference images not displayed]

FINDINGS: Heart is enlarged. Mediastinum appears stable. Pulmonary vasculature
is within normal limits. No focal consolidation identified. No
significant pleural effusion or pneumothorax identified.
IMPRESSION: Cardiomegaly with no acute process identified.

## 2023-07-28 ENCOUNTER — Ambulatory Visit (INDEPENDENT_AMBULATORY_CARE_PROVIDER_SITE_OTHER): Payer: BC Managed Care – PPO | Admitting: *Deleted

## 2023-07-28 DIAGNOSIS — B351 Tinea unguium: Secondary | ICD-10-CM

## 2023-07-28 NOTE — Progress Notes (Signed)
Patient presents today for laser maintenance. Diagnosed with mycotic nail infection by Dr. Allena Katz.    Toenail most affected nail is the right hallux.    All other systems are negative.   Nails were filed thin. Laser therapy was administered to 1-5 bilateral toenail and patient tolerated the treatment well. All safety precautions were in place.    Single pass done on unaffected toenails.  Patient will continue treatments every 8 weeks for maintenance.

## 2023-08-17 IMAGING — CT CT ABD-PELV W/ CM
2 of 4 series · 12 of 46 positions shown, 14 images · IV contrast (iopamidol)
Comparison: Prior CT scan 09/08/2021

CLINICAL DATA: 55-year-old male with intra-abdominal hematoma
formation following appendectomy. A CT-guided drain was placed on
09/09/2021. Drain output is now essentially nil despite forward
flushing. Patient is asymptomatic.

EXAM:
CT ABDOMEN AND PELVIS WITH CONTRAST
TECHNIQUE: Multidetector CT imaging of the abdomen and pelvis was performed
using the standard protocol following bolus administration of
intravenous contrast.
CONTRAST:  100mL 5EJY5V-8SS IOPAMIDOL (5EJY5V-8SS) INJECTION 61%

[Series 2: abd pelvis 5.00 br40 s3 axial · axial · 0.66mm/px · z∈[+1097,+1542]mm · 9 of 107 slices shown, 11 images]
[im 9/107  soft-tissue]
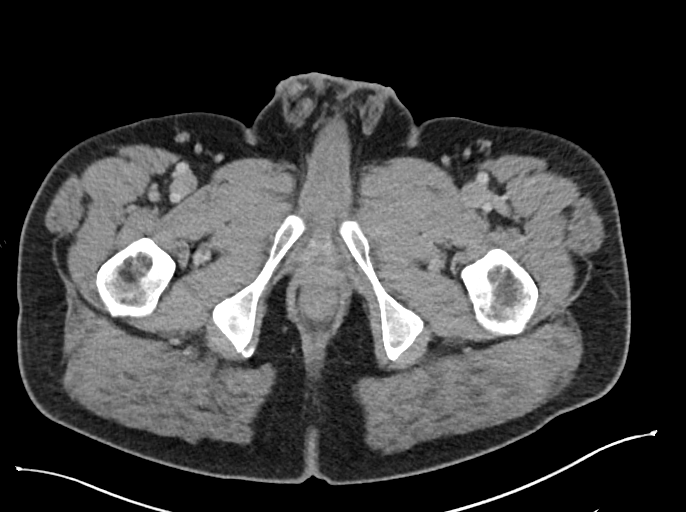
[im 9/107  bone]
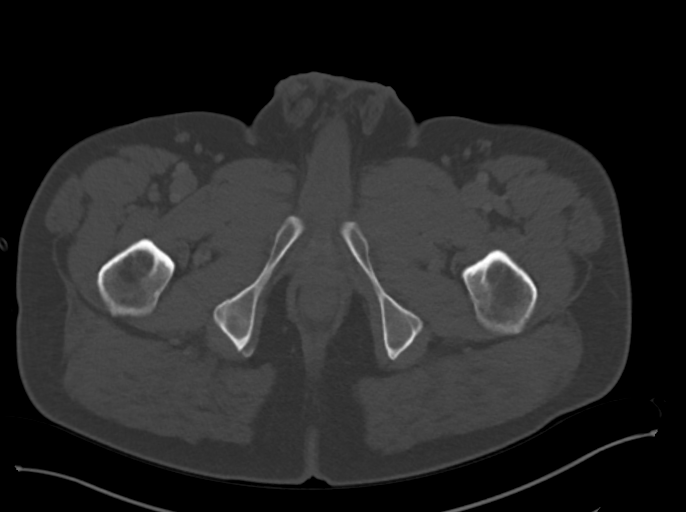
[im 18/107  soft-tissue]
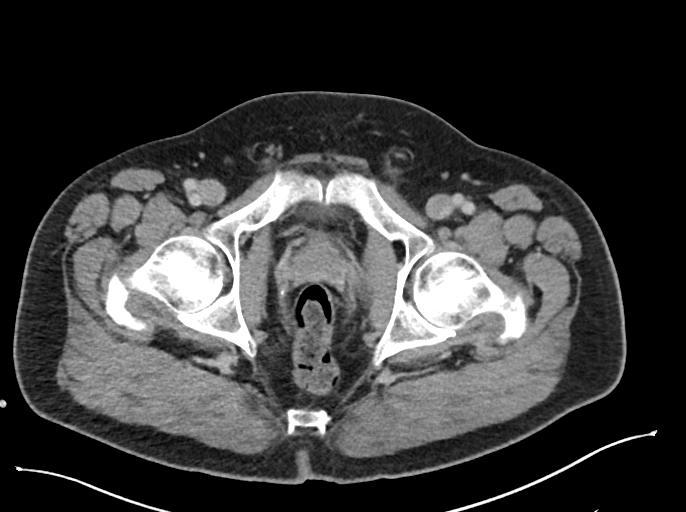
[im 31/107  soft-tissue]
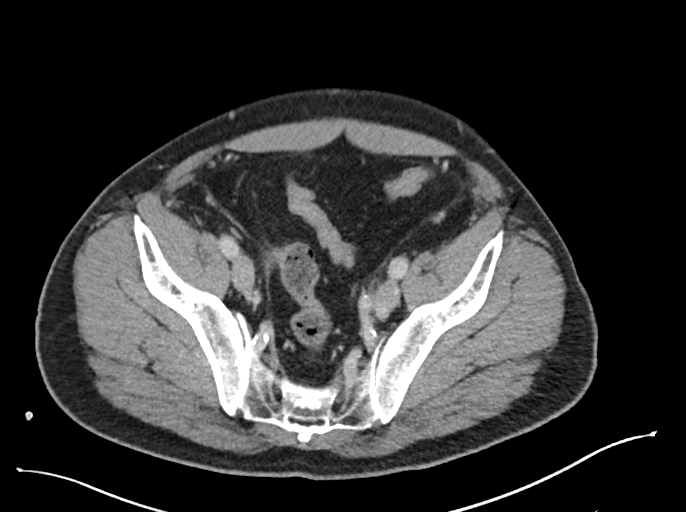
[im 40/107  soft-tissue]
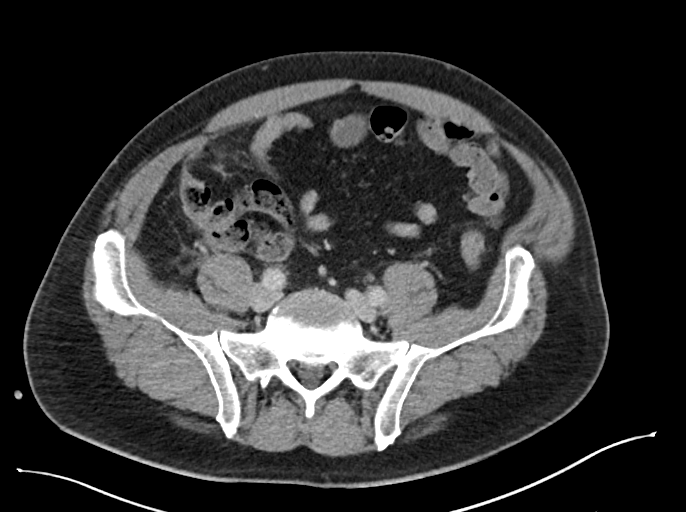
[im 54/107  soft-tissue]
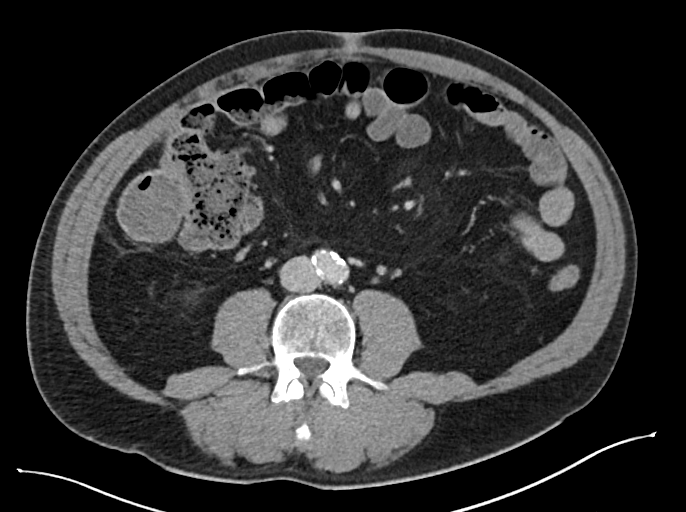
[im 67/107  soft-tissue]
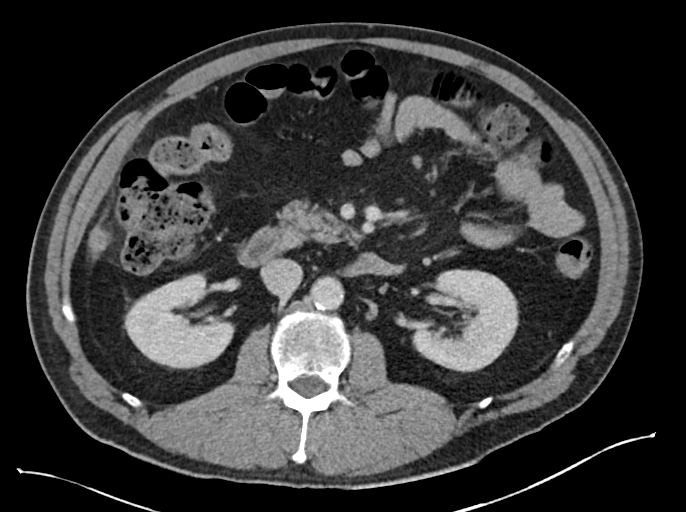
[im 76/107  soft-tissue]
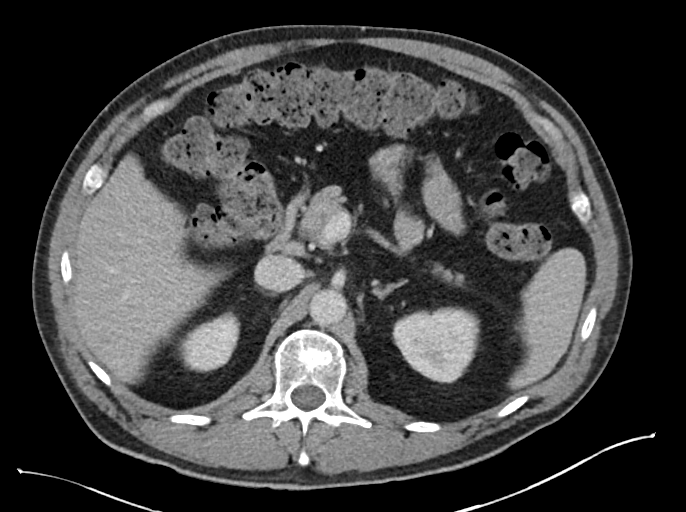
[im 89/107  soft-tissue]
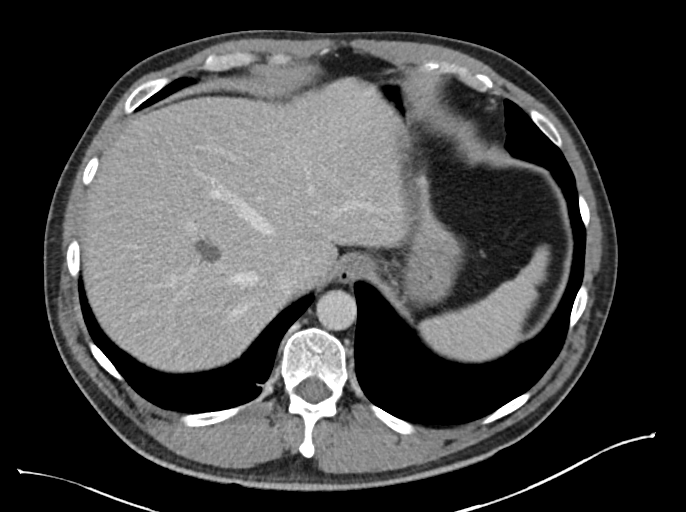
[im 98/107  soft-tissue]
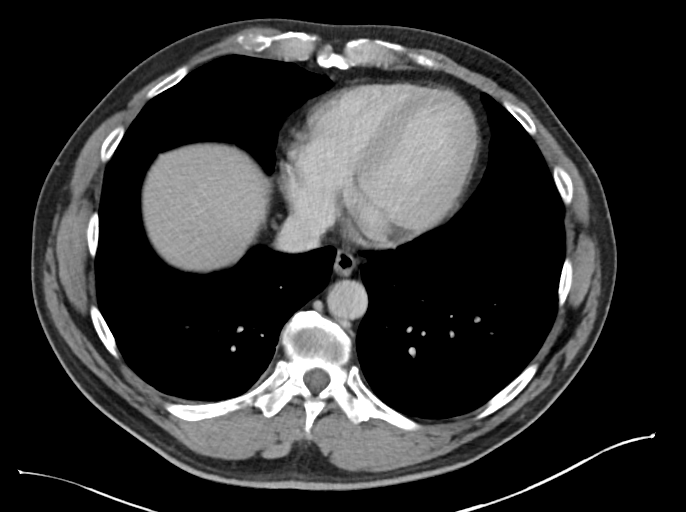
[im 98/107  bone]
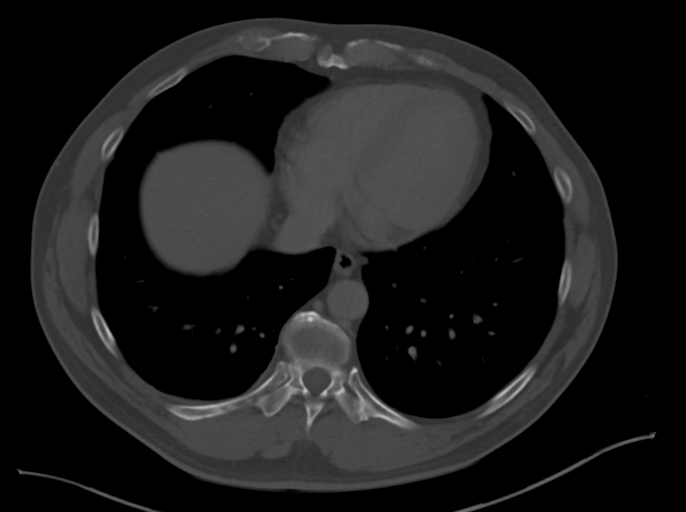

[Series 6: abd pelvis 2.00 br40 s3 cor · coronal · 0.82mm/px · 3 of 156 slices shown]
[im 52/156  soft-tissue]
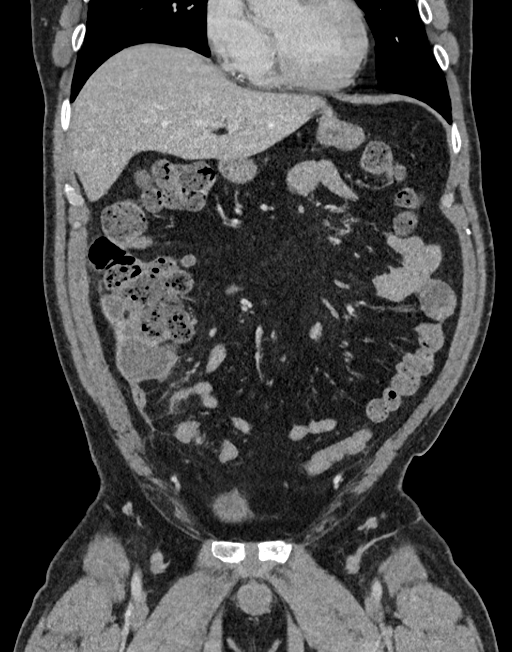
[im 69/156  soft-tissue]
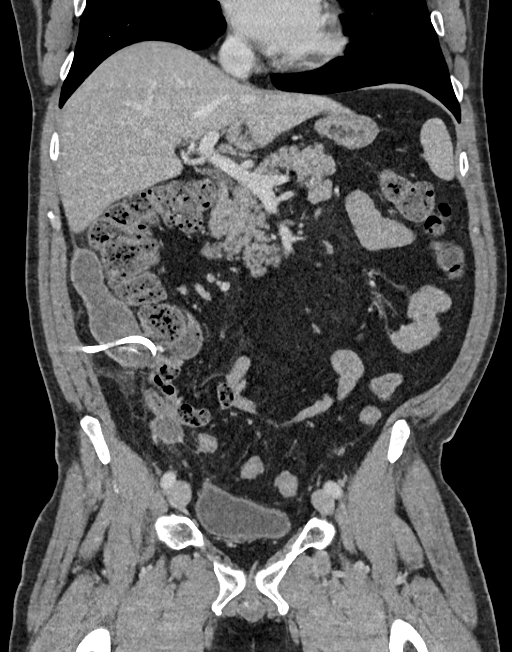
[im 87/156  soft-tissue]
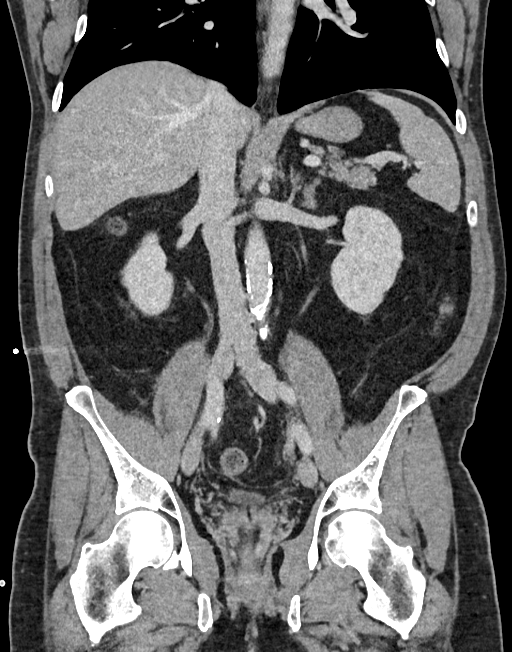

[12 of 46 positions shown; findings below may reference images not displayed]

FINDINGS: Lower chest: No acute abnormality.

Hepatobiliary: No focal solid liver abnormality is seen. Stable
scattered small hepatic cysts. Status post cholecystectomy. No
biliary dilatation.

Pancreas: Unremarkable. No pancreatic ductal dilatation or
surrounding inflammatory changes.

Spleen: Normal in size without focal abnormality.

Adrenals/Urinary Tract: Adrenal glands are unremarkable. Kidneys are
normal, without renal calculi, focal lesion, or hydronephrosis.
Bladder is unremarkable.

Stomach/Bowel: Surgical changes of appendectomy. No focal bowel wall
thickening or evidence of obstruction.

Vascular/Lymphatic: Atherosclerotic calcifications along the
abdominal aorta. No aneurysm. No suspicious lymphadenopathy.

Reproductive: Prostate is unremarkable.

Other: Right lower quadrant percutaneous drainage catheter in
unchanged position. Minimal interval change in the size of the
heterogeneously attenuating fluid collection in the right lower
quadrant adjacent to the cecum at the site of the known hematoma.
The drainage catheter remains well position. There is a small amount
of gas within the collection.

Musculoskeletal: Stable L1 superior endplate compression fracture
with associated Schmorl's nodule.
IMPRESSION: 1. Persistent but slightly improved pericecal fluid collection in
the region of the patient's known postoperative hematoma. The
drainage catheter remains well positioned within the fluid.

## 2023-09-29 ENCOUNTER — Other Ambulatory Visit: Payer: BC Managed Care – PPO

## 2023-10-26 ENCOUNTER — Emergency Department (HOSPITAL_BASED_OUTPATIENT_CLINIC_OR_DEPARTMENT_OTHER)
Admission: EM | Admit: 2023-10-26 | Discharge: 2023-10-26 | Disposition: A | Discharge status: Home / Self Care | Payer: 59 | Attending: Emergency Medicine | Admitting: Emergency Medicine

## 2023-10-26 ENCOUNTER — Other Ambulatory Visit: Payer: Self-pay

## 2023-10-26 ENCOUNTER — Emergency Department (HOSPITAL_BASED_OUTPATIENT_CLINIC_OR_DEPARTMENT_OTHER): Payer: 59 | Admitting: Radiology

## 2023-10-26 ENCOUNTER — Telehealth: Payer: Self-pay | Admitting: Cardiology

## 2023-10-26 ENCOUNTER — Encounter (HOSPITAL_BASED_OUTPATIENT_CLINIC_OR_DEPARTMENT_OTHER): Payer: Self-pay | Admitting: Emergency Medicine

## 2023-10-26 DIAGNOSIS — Z79899 Other long term (current) drug therapy: Secondary | ICD-10-CM | POA: Diagnosis not present

## 2023-10-26 DIAGNOSIS — R0789 Other chest pain: Secondary | ICD-10-CM | POA: Diagnosis present

## 2023-10-26 DIAGNOSIS — Z20822 Contact with and (suspected) exposure to covid-19: Secondary | ICD-10-CM | POA: Insufficient documentation

## 2023-10-26 DIAGNOSIS — I10 Essential (primary) hypertension: Secondary | ICD-10-CM | POA: Diagnosis not present

## 2023-10-26 DIAGNOSIS — R079 Chest pain, unspecified: Secondary | ICD-10-CM

## 2023-10-26 DIAGNOSIS — Z7982 Long term (current) use of aspirin: Secondary | ICD-10-CM | POA: Diagnosis not present

## 2023-10-26 LAB — BASIC METABOLIC PANEL
Anion gap: 11 (ref 5–15)
BUN: 12 mg/dL (ref 6–20)
CO2: 25 mmol/L (ref 22–32)
Calcium: 9 mg/dL (ref 8.9–10.3)
Chloride: 104 mmol/L (ref 98–111)
Creatinine, Ser: 0.94 mg/dL (ref 0.61–1.24)
GFR, Estimated: >60 mL/min (ref >60)
Glucose, Bld: 136 mg/dL — ABNORMAL HIGH (ref 70–99)
Potassium: 3.8 mmol/L (ref 3.5–5.1)
Sodium: 140 mmol/L (ref 135–145)

## 2023-10-26 LAB — CBC
HCT: 45 % (ref 39.0–52.0)
Hemoglobin: 15.9 g/dL (ref 13.0–17.0)
MCH: 31.4 pg (ref 26.0–34.0)
MCHC: 35.3 g/dL (ref 30.0–36.0)
MCV: 88.9 fL (ref 80.0–100.0)
Platelets: 239 10*3/uL (ref 150–400)
RBC: 5.06 MIL/uL (ref 4.22–5.81)
RDW: 12.9 % (ref 11.5–15.5)
WBC: 6.6 10*3/uL (ref 4.0–10.5)
nRBC: 0 % (ref 0.0–0.2)

## 2023-10-26 LAB — RESP PANEL BY RT-PCR (RSV, FLU A&B, COVID)  RVPGX2
Influenza A by PCR: NEGATIVE
Influenza B by PCR: NEGATIVE
Resp Syncytial Virus by PCR: NEGATIVE
SARS Coronavirus 2 by RT PCR: NEGATIVE

## 2023-10-26 LAB — TROPONIN I (HIGH SENSITIVITY)
Troponin I (High Sensitivity): 5 ng/L (ref <18)
Troponin I (High Sensitivity): 6 ng/L (ref <18)

## 2023-10-26 MED ORDER — NITROGLYCERIN 0.4 MG SL SUBL: 0.4000 mg | SUBLINGUAL_TABLET | SUBLINGUAL | 0 refills | Status: AC | PRN

## 2023-10-26 NOTE — Telephone Encounter (Signed)
FYI  Pt c/o of Chest Pain: STAT if active CP, including tightness, pressure, jaw pain, radiating pain to shoulder/upper arm/back, CP unrelieved by Nitro. Symptoms reported of SOB, nausea, vomiting, sweating.  1. Are you having CP right now? yes  In ER right now at [facility]  2. Are you experiencing any other symptoms (ex. SOB, nausea, vomiting, sweating)? no   3. Is your CP continuous or coming and going? Continuous then coming and going    4. Have you taken Nitroglycerin? no   5. How long have you been experiencing CP? Today     6. If NO CP at time of call then end call with telling Pt to call back or call 911 if Chest pain returns prior to return call from triage team.

## 2023-10-26 NOTE — ED Provider Notes (Signed)
[place] EMERGENCY DEPARTMENT AT [facility] Provider Note   CSN: [id number] Arrival date & time: 10/26/23  1213     History  Chief Complaint  Patient presents with  . Chest Pain    Christopher Collins is a 58 y.o. male.   Chest Pain 58 year old male history of hypertension presenting for chest pain.  Patient states today while driving he developed some chest pressure across his chest that went to the left side of his chest.  Maybe some shortness of breath with lasted for a while that improved.  Was not exertional.  It is somewhat better when he was burping.  It has essentially resolved at this point.  He had episodes like this before but today was slightly worse than typical.  He has seen Dr. Antoine Poche with cardiology who did a coronary CT which showed calcified plaque in the mid LAD, he has no stents and has not had a cath.  No nausea vomiting diaphoresis.  No history of PE or DVT or recent long travel.  No leg swelling.  No fevers or chills.  He otherwise feels okay.     Home Medications Prior to Admission medications   Medication Sig Start Date End Date Taking? Authorizing Provider  acetaminophen (TYLENOL) 325 MG tablet Take 2 tablets (650 mg total) by mouth every 6 (six) hours as needed for mild pain or moderate pain. 08/31/21   Eric Form, PA-C  Alpha-Lipoic Acid 300 MG TABS Take 300 mg by mouth at bedtime.    [provider]  ALPRAZolam Prudy Feeler) 0.25 MG tablet Take 0.125-0.25 mg by mouth at bedtime as needed for anxiety or sleep. 02/26/20   [provider]  amLODipine (NORVASC) 10 MG tablet Take 10 mg by mouth at bedtime. Patient not taking: Reported on 05/20/2022 08/11/21   [provider]  amLODipine (NORVASC) 5 MG tablet Take 1 tablet by mouth daily.    [provider]  aspirin EC 81 MG tablet Take 1 tablet (81 mg total) by mouth daily. 02/13/20   Rollene Rotunda, MD  Cholecalciferol (VITAMIN D-3) 125 MCG (5000 UT) TABS Take  5,000 Units by mouth every other day.    [provider]  GINKGO BILOBA PO Take 1 tablet by mouth every morning. "[id number]"    [provider]  ibuprofen (ADVIL) 200 MG tablet Take 400 mg by mouth every 4 (four) hours as needed (pain).    [provider]  lamoTRIgine (LAMICTAL) 150 MG tablet Take 150 mg by mouth every morning. 03/20/21   [provider]  lamoTRIgine (LAMICTAL) 25 MG tablet Take 25 mg by mouth daily. 03/03/22   [provider]  lisinopril (PRINIVIL,ZESTRIL) 10 MG tablet Take 10 mg by mouth at bedtime.    [provider]  MAGNESIUM PO Take 1 capsule by mouth at bedtime.    [provider]  mirtazapine (REMERON) 15 MG tablet Take 15 mg by mouth at bedtime. Take with a 45 mg tablet for a total nightly dose of 60 mg 03/13/21   [provider]  mirtazapine (REMERON) 45 MG tablet Take 45 mg by mouth at bedtime. Take with a 15 mg tablet for a total nightly dose of 60 mg    [provider]  mometasone (NASONEX) 50 MCG/ACT nasal spray Place 2 sprays into the nose daily as needed (seasonal allergies). 04/07/14   [provider]  montelukast (SINGULAIR) 10 MG tablet Take 1 tablet (10 mg total) by mouth daily. Patient not taking:  Reported on 05/20/2022 08/10/15   Jessica Priest, MD  Multiple Vitamins-Minerals (ZINC PO) Take 1 capsule by mouth at bedtime.    [provider]  nitroGLYCERIN (NITROSTAT) 0.4 MG SL tablet Place 1 tablet (0.4 mg total) under the tongue every 5 (five) minutes as needed for chest pain. 02/13/20 08/28/21  Rollene Rotunda, MD  PREVIDENT 5000 ENAMEL PROTECT 1.1-5 % GEL Place 1 application onto teeth 2 (two) times daily. 11/12/20   [provider]  rosuvastatin (CRESTOR) 10 MG tablet Take 10 mg by mouth every morning. Take with a 5 mg tablet for a total daily dose of 15 mg    [provider]  rosuvastatin (CRESTOR) 5 MG tablet Take 5 mg by mouth every morning. Take with a 10  mg tablet for a total daily dose of 15 mg 08/25/21   [provider]  Sodium Chloride Flush (NORMAL SALINE FLUSH) 0.9 % SOLN Flush drainage catheter 1-2 times daily with 5-10 mL. 09/09/21   Matthews, Senaida Ores, PA  SYSTANE 0.4-0.3 % SOLN Place 1 drop into both eyes every 2 (two) hours.    [provider]      Allergies    Nuvigil [armodafinil], Smallpox virus vaccine live, and Doxycycline    Review of Systems   Review of Systems  Cardiovascular:  Positive for chest pain.  Review of systems completed and notable as per HPI.  ROS otherwise negative.   Physical Exam Updated Vital Signs BP (!) 143/87   Pulse 61   Temp 97.9 F (36.6 C)   Resp 13   Wt 106.6 kg   SpO2 96%   BMI 31.43 kg/m  Physical Exam Vitals and nursing note reviewed.  Constitutional:      General: He is not in acute distress.    Appearance: He is well-developed.  HENT:     Head: Normocephalic and atraumatic.  Eyes:     Extraocular Movements: Extraocular movements intact.     Conjunctiva/sclera: Conjunctivae normal.     Pupils: Pupils are equal, round, and reactive to light.  Cardiovascular:     Rate and Rhythm: Normal rate and regular rhythm.     Pulses: Normal pulses.     Heart sounds: Normal heart sounds. No murmur heard. Pulmonary:     Effort: Pulmonary effort is normal. No respiratory distress.     Breath sounds: Normal breath sounds.  Abdominal:     Palpations: Abdomen is soft.     Tenderness: There is no abdominal tenderness.  Musculoskeletal:        General: No swelling.     Cervical back: Neck supple.     Right lower leg: No edema.     Left lower leg: No edema.  Skin:    General: Skin is warm and dry.     Capillary Refill: Capillary refill takes less than 2 seconds.  Neurological:     General: No focal deficit present.     Mental Status: He is alert and oriented to person, place, and time. Mental status is at baseline.  Psychiatric:        Mood and Affect: Mood  normal.     ED Results / Procedures / Treatments   Labs (all labs ordered are listed, but only abnormal results are displayed) Labs Reviewed  BASIC METABOLIC PANEL - Abnormal; Notable for the following components:      Result Value   Glucose, Bld 136 (*)    All other components within normal limits  RESP PANEL  BY RT-PCR (RSV, FLU A&B, COVID)  [id number]  CBC  TROPONIN I (HIGH SENSITIVITY)  TROPONIN I (HIGH SENSITIVITY)    EKG EKG Interpretation Date/Time:  Thursday October 26 2023 12:28:55 EST Ventricular Rate:  79 PR Interval:  168 QRS Duration:  92 QT Interval:  360 QTC Calculation: 412 R Axis:   94  Text Interpretation: Normal sinus rhythm Rightward axis Nonspecific ST abnormality Abnormal ECG When compared with ECG of 29-Jan-2020 16:18, No significant change was found Confirmed by Fulton Reek [phone]) on 10/26/2023 3:58:15 PM  Radiology DG Chest 2 View Result Date: 10/26/2023 CLINICAL DATA:  Chest pain and tightness. EXAM: CHEST - 2 VIEW COMPARISON:  Chest radiograph 08/30/2021 FINDINGS: The heart size and mediastinal contours are within normal limits. Both lungs are clear. The visualized skeletal structures are unremarkable. IMPRESSION: No active cardiopulmonary disease. Electronically Signed   By: Annia Belt M.D.   On: 10/26/2023 14:57    Procedures Procedures    Medications Ordered in ED Medications - No data to display  ED Course/ Medical Decision Making/ A&P Clinical Course as of 10/26/23 1732  Thu Oct 26, 2023  1732 Patient requested nitroglycerin refill. [JD]    Clinical Course User Index [JD] Laurence Spates, MD                                 Medical Decision Making Amount and/or Complexity of Data Reviewed Labs: ordered. Radiology: ordered.   Medical Decision Making:   KEISHON CHAVARIN is a 58 y.o. male who presented to the ED today with episode of chest pain.  Vital signs notable for mild hypertension.  EKG is nonischemic without  significant change from prior.  His pain is atypical, nonexertional and improved with burping.  He has 2 negative troponins here.  Does have risk factors with coronary CT findings of some mild LAD plaque.  However given no ongoing pain, negative troponins and reassuring EKG with reassuring history I do not see any signs of ACS at this time and I think he can follow-up closely with cardiology.  I have low suspicion for PE, dissection.  Remainder of his workup is reassuring.  Patient is comfortable this plan.  Will place referral and he will call the office for Dr. Antoine Poche today to schedule close follow-up.  Strict return precautions were given.   Patient placed on continuous vitals and telemetry monitoring while in ED which was reviewed periodically.  Reviewed and confirmed nursing documentation for past medical history, family history, social history.   Patient's presentation is most consistent with acute complicated illness / injury requiring diagnostic workup.           Final Clinical Impression(s) / ED Diagnoses Final diagnoses:  Chest pain, unspecified type    Rx / DC Orders ED Discharge Orders     None         Laurence Spates, MD 10/26/23 [phone]

## 2023-10-26 NOTE — ED Triage Notes (Signed)
Pt c/o non-radiating chest tightness today. Denies cough. Burping improves symptoms

## 2023-10-26 NOTE — Telephone Encounter (Signed)
Patient identification verified by 2 forms. Marilynn Rail, RN    Called and spoke to patient  Patient states:   -developed chest pain this morning   -currently in the ED   -would like follow up with cardiology  Patient scheduled for OV 2/17 at 2:20pm with NP Molli Hazard  Advised patient to follow up ED discharge recommendations  Advised patient to present to ED if symptoms return or worsen after discharge  Patient verbalized understanding, no questions at this time

## 2023-10-26 NOTE — Discharge Instructions (Signed)
Your workup was reassuring.  I placed a referral for cardiology I recommend you call your cardiologist tomorrow to schedule close follow-up.  If you develop worsening chest pain, shortness of breath or any other new concerning symptoms you should return to the ED.

## 2023-10-26 NOTE — ED Provider Triage Note (Signed)
Emergency Medicine Provider Triage Evaluation Note  Christopher Collins , a 58 y.o. male  was evaluated in triage.  Pt complains of sudden CP that started at 1030. Located across entire chest. Has improved but not completely resolved. Burping makes CP better. Pain worsened with movement but no known injury. Hx of CAD, HTN  Follows with cardiology  Review of Systems  Positive: CP Negative: SOB, cough, fevers  Physical Exam  BP (!) 155/90 (BP Location: Right Arm)   Pulse 76   Temp 97.9 F (36.6 C)   Resp 18   Wt 106.6 kg   SpO2 97%   BMI 31.43 kg/m  Gen:   Awake, no distress   Resp:  Normal effort  MSK:   Moves extremities without difficulty  Other:    Medical Decision Making  Medically screening exam initiated at 3:18 PM.  Appropriate orders placed.  MUHSIN DORIS was informed that the remainder of the evaluation will be completed by another provider, this initial triage assessment does not replace that evaluation, and the importance of remaining in the ED until their evaluation is complete.  Troponin negative x1. CXR neg. EKG unchanged from prior.  Pending delta troponin   Judithann Sheen, [place] 10/26/23 1523

## 2023-10-27 ENCOUNTER — Ambulatory Visit (INDEPENDENT_AMBULATORY_CARE_PROVIDER_SITE_OTHER): Payer: BC Managed Care – PPO

## 2023-10-27 DIAGNOSIS — B351 Tinea unguium: Secondary | ICD-10-CM

## 2023-10-27 NOTE — Progress Notes (Signed)
Patient presents today for laser maintenance. Diagnosed with mycotic nail infection by Dr. Allena Katz.    Toenail most affected is the right hallux.     All other systems are negative.   Nails were filed thin. Laser therapy was administered to 1-5 bilateral toenail and patient tolerated the treatment well. All safety precautions were in place.      Patient will continue treatments every 8 weeks for maintenance.

## 2023-10-30 ENCOUNTER — Encounter: Payer: Self-pay | Admitting: General Practice

## 2023-10-30 ENCOUNTER — Ambulatory Visit: Payer: 59 | Attending: General Practice | Admitting: Emergency Medicine

## 2023-10-30 VITALS — BP 134/76 | HR 72 | Ht 72.0 in | Wt 237.6 lb

## 2023-10-30 DIAGNOSIS — I10 Essential (primary) hypertension: Secondary | ICD-10-CM | POA: Diagnosis not present

## 2023-10-30 DIAGNOSIS — I2510 Atherosclerotic heart disease of native coronary artery without angina pectoris: Secondary | ICD-10-CM

## 2023-10-30 DIAGNOSIS — R072 Precordial pain: Secondary | ICD-10-CM

## 2023-10-30 DIAGNOSIS — E785 Hyperlipidemia, unspecified: Secondary | ICD-10-CM

## 2023-10-30 NOTE — Patient Instructions (Addendum)
Medication Instructions:  The current medical regimen is effective;  continue present plan and medications as directed. Please refer to the Current Medication list given to you today.  *If you need a refill on your cardiac medications before your next appointment, please call your pharmacy*  Lab Work: NONE If you have labs (blood work) drawn today and your tests are completely normal, you will receive your results only by:  MyChart Message (if you have MyChart) OR A paper copy in the mail If you have any lab test that is abnormal or we need to change your treatment, we will call you to review the results.  Other Instructions    MESSAGE IF YOU WANT STRESS TEST OR CORONARY CTA  Follow-Up: At [facility], you and your health needs are our priority.  As part of our continuing mission to provide you with exceptional heart care, we have created designated Provider Care Teams.  These Care Teams include your primary Cardiologist (physician) and Advanced Practice Providers (APPs -  Physician Assistants and Nurse Practitioners) who all work together to provide you with the care you need, when you need it.  Your next appointment:   6 month(s)  Provider:   Rollene Rotunda, MD

## 2023-10-30 NOTE — Progress Notes (Signed)
Cardiology Office Note:    Date:  10/30/2023  ID:  Christopher Collins, DOB 1966/03/14, MRN [id number] PCP: Shon Hale, MD  [facility] Providers Cardiologist:  Rollene Rotunda, MD       Patient Profile:      Christopher Collins is a 58 y.o. male with visit-pertinent history of chest pain  He established with cardiology service 01/2020 for evaluation of chest pain.  CT cardiac scoring May 2021 showed coronary calcium score of 437 which was 99th percentile for age and sex.  AAA vascular ultrasound June 2021 showed no evidence of abdominal aortic aneurysm.  Follow-up coronary CTA June 2021 showed coronary calcium score of 458 (96 percentile) and no evidence of obstructive CAD with minimal to mild plaque noted in LM, LAD, RCA.  Exercise tolerance test June 2022 with no evidence of ischemia.  Last seen in clinic on 05/2022 is doing well at the time with no changes in current regimen.  He was recently seen in the ED on 10/26/2023 for the evaluation of chest pain.  His chest pain was nonexertional and was somewhat better when he was burping.  The chest pain had essentially resolved by the time he mated to the ED.  High sensitive troponins 5, 6.  Chest x-ray with no active cardiopulmonary disease.  EKG was nonischemic.  He was to follow-up with cardiology outpatient.      History of Present Illness:  Discussed the use of AI scribe software for clinical note transcription with the patient, who gave verbal consent to proceed.  Christopher Collins is a 58 y.o. male who returns for acute visit for chest pain.  Patient comes into the clinic alone today.  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  He notes that he has had a particularly stressful past year.  He notes due to increased stress he has exercise less often and is not eating as healthy as he would like.  He notes starting around 2021 he developed nonexertional chest pain that was described as discomfort that would come and go  intermittently over the past several years.  He noted this pain was associated with indigestion alleviated with Sprite and burping and would start gradually and and gradually.  However he notes last week he developed chest pain that was different to him.  He noted this episode started suddenly while sitting in his car.  The pain is described as a tightness and radiated across his entire chest.  He notes he did not eat or drink prior to pain.  He notes the pain lasted just around 1 hour and dissipated gradually.  He denied any radiation to his arm or back or jaw.  He denied any dyspnea on exertion, syncope, presyncope, lightheadedness, dizziness.  He notes he has not had another episode of chest pain since this day.  He specifically denies any exertional angina.  He also notes he had trialed his rosuvastatin at 15 mg however with his recent LDL cholesterol of 87 on 08/2023 him and his PCP had agreed to increase his rosuvastatin to 20 mg.       Review of Systems  Constitutional: Negative for weight gain and weight loss.  Cardiovascular:  Positive for chest pain. Negative for claudication, dyspnea on exertion, irregular heartbeat, leg swelling, near-syncope, orthopnea, palpitations, paroxysmal nocturnal dyspnea and syncope.  Respiratory:  Negative for shortness of breath.   Gastrointestinal:  Negative for abdominal pain.  Neurological:  Negative for dizziness and light-headedness.  See HPI     Home Medications:    Prior to Admission medications   Medication Sig Start Date End Date Taking? Authorizing Provider  acetaminophen (TYLENOL) 325 MG tablet Take 2 tablets (650 mg total) by mouth every 6 (six) hours as needed for mild pain or moderate pain. 08/31/21   Eric Form, PA-C  Alpha-Lipoic Acid 300 MG TABS Take 300 mg by mouth at bedtime.    [provider]  ALPRAZolam Prudy Feeler) 0.25 MG tablet Take 0.125-0.25 mg by mouth at bedtime as needed for anxiety or sleep. 02/26/20   [provider]  amLODipine (NORVASC) 10 MG tablet Take 10 mg by mouth at bedtime. Patient not taking: Reported on 05/20/2022 08/11/21   [provider]  amLODipine (NORVASC) 5 MG tablet Take 1 tablet by mouth daily.    [provider]  aspirin EC 81 MG tablet Take 1 tablet (81 mg total) by mouth daily. 02/13/20   Rollene Rotunda, MD  Cholecalciferol (VITAMIN D-3) 125 MCG (5000 UT) TABS Take 5,000 Units by mouth every other day.    [provider]  GINKGO BILOBA PO Take 1 tablet by mouth every morning. "[id number]"    [provider]  ibuprofen (ADVIL) 200 MG tablet Take 400 mg by mouth every 4 (four) hours as needed (pain).    [provider]  lamoTRIgine (LAMICTAL) 150 MG tablet Take 150 mg by mouth every morning. 03/20/21   [provider]  lamoTRIgine (LAMICTAL) 25 MG tablet Take 25 mg by mouth daily. 03/03/22   [provider]  lisinopril (PRINIVIL,ZESTRIL) 10 MG tablet Take 10 mg by mouth at bedtime.    [provider]  MAGNESIUM PO Take 1 capsule by mouth at bedtime.    [provider]  mirtazapine (REMERON) 15 MG tablet Take 15 mg by mouth at bedtime. Take with a 45 mg tablet for a total nightly dose of 60 mg 03/13/21   [provider]  mirtazapine (REMERON) 45 MG tablet Take 45 mg by mouth at bedtime. Take with a 15 mg tablet for a total nightly dose of 60 mg    [provider]  mometasone (NASONEX) 50 MCG/ACT nasal spray Place 2 sprays into the nose daily as needed (seasonal allergies). 04/07/14   [provider]  montelukast (SINGULAIR) 10 MG tablet Take 1 tablet (10 mg total) by mouth daily. Patient not taking: Reported on 05/20/2022 08/10/15   Jessica Priest, MD  Multiple Vitamins-Minerals (ZINC PO) Take 1 capsule by mouth at bedtime.    [provider]  nitroGLYCERIN (NITROSTAT) 0.4 MG SL tablet Place 1 tablet (0.4 mg total) under the tongue every 5 (five) minutes as needed for chest  pain. 10/26/23 01/24/24  Laurence Spates, MD  PREVIDENT 5000 ENAMEL PROTECT 1.1-5 % GEL Place 1 application onto teeth 2 (two) times daily. 11/12/20   [provider]  rosuvastatin (CRESTOR) 10 MG tablet Take 10 mg by mouth every morning. Take with a 5 mg tablet for a total daily dose of 15 mg    [provider]  rosuvastatin (CRESTOR) 5 MG tablet Take 5 mg by mouth every morning. Take with a 10 mg tablet for a total daily dose of 15 mg 08/25/21   [provider]  Sodium Chloride Flush (NORMAL SALINE FLUSH) 0.9 % SOLN Flush drainage catheter 1-2 times daily with 5-10 mL. 09/09/21   Matthews, Senaida Ores, PA  SYSTANE 0.4-0.3 % SOLN Place 1 drop into both eyes  every 2 (two) hours.    [provider]   Studies Reviewed:       Exercise tolerance test 02/17/2021 Patient exercised according to the CVN-BRUCE protocol for 10:29min achieving 11.7 METs The resting HR was 66bpm and rose to a maximum of 130bpm which was 78% of maximal, age predicted HR. There was no ST segment deviation noted during stress. No T wave inversion was noted during stress. There was no electrocardiographic evidence of ischemia, however, patient only reached 78% of max predicted HR.  Coronary CTA 02/14/2020 1. Coronary calcium score of 458. This was 96th percentile for age and sex matched control.   2. Normal coronary origin with right dominance.   3. No evidence of obstructive CAD.   4.  Minimal to mild CAD noted in the LM, LAD, and RCA.   5. Recommend aggressive risk factor modification including high potency statin. Risk Assessment/Calculations:             Physical Exam:   VS:  BP 134/76 (BP Location: Right Arm, Patient Position: Sitting, Cuff Size: Normal)   Pulse 72   Ht 6' (1.829 m)   Wt 237 lb 9.6 oz (107.8 kg)   SpO2 96%   BMI 32.22 kg/m    Wt Readings from Last 3 Encounters:  10/30/23 237 lb 9.6 oz (107.8 kg)  10/26/23 235 lb (106.6 kg)  07/28/22 230 lb (104.3 kg)     Constitutional:      Appearance: Normal and healthy appearance. Not in distress.  Neck:     Vascular: JVD normal.  Pulmonary:     Effort: Pulmonary effort is normal.     Breath sounds: Normal breath sounds.  Chest:     Chest wall: Not tender to palpatation.  Cardiovascular:     PMI at left midclavicular line. Normal rate. Regular rhythm. Normal S1. Normal S2.      Murmurs: There is no murmur.     No gallop.  No click. No rub.  Pulses:    Intact distal pulses.  Edema:    Peripheral edema absent.  Musculoskeletal: Normal range of motion.     Cervical back: Normal range of motion and neck supple. Skin:    General: Skin is warm and dry.  Neurological:     General: No focal deficit present.     Mental Status: Alert, oriented to person, place, and time and oriented to person, place and time.  Psychiatric:        Mood and Affect: Mood and affect normal.        Behavior: Behavior is cooperative.        Thought Content: Thought content normal.        Assessment and Plan:  Coronary artery disease / Chest pain Coronary CTA 02/14/2020 with CAC 458 (96 percentile), no evidence of obstructive CAD with minimal to mild CAD noted in LM, LAD, RCA. ETT 02/2021 with no ischemia He notes recent episode of nonexertional chest pain described as tightness across the entire chest that occurred suddenly, lasting greater than 1 hour but dissipated gradually.  There was no radiation of his pain and he denied any aggravating/alleviating factors.   He does have history of chest pain however this episode was different from others given past episodes typically described as discomfort, starting gradually, alleviated by belching/Sprite. ED visit 09/2023 with normal troponins and nonischemic EKG -Discussed atypical nature of his chest pain however through shared decision making with the patient, we have opted for further ischemic testing -  We discussed ordering coronary CTA vs Lexiscan stress test to further  evaluate his chest pain and discussed risk and benefits for both.   -He noted he would like to research both test and message me over MyChart after he has made his decision.  Will order preferred testing method once I hear from patient this week. -ED precautions given  -Continue aspirin 81 mg daily, rosuvastatin 20 mg daily  Hypertension Blood pressure today 134/76 under adequate control -Continue amlodipine 5 mg daily, lisinopril 10 mg daily -Encouraged to monitor blood pressure at home -Encouraged heart healthy dieting and 150 minutes of moderate intensity aerobic exercise weekly  Hyperlipidemia LDL 87 on 08/30/2023 and currently not well-controlled, above goal of less than 70.   His rosuvastatin was increased to 20 mg from 15 mg in 08/2023 by PCP.  Recommend he have a repeat lipid panel/LFTs drawn in 3 months. -Encouraged decrease processed foods and increase foods high in fiber              Dispo:  Return in about 6 months with Dr. Antoine Poche or sooner pending ischemic evaluation.  Signed, Denyce Robert, NP

## 2023-12-22 ENCOUNTER — Ambulatory Visit (INDEPENDENT_AMBULATORY_CARE_PROVIDER_SITE_OTHER): Payer: Self-pay

## 2023-12-22 DIAGNOSIS — B351 Tinea unguium: Secondary | ICD-10-CM

## 2023-12-22 NOTE — Progress Notes (Signed)
 Patient presents today for laser maintenance. Diagnosed with mycotic nail infection by Dr. Allena Katz.    Toenail most affected is the right hallux. Patient concerned about the left hallux looking dark again.     All other systems are negative.   Nails were filed thin. Laser therapy was administered to 1-5 bilateral toenail and patient tolerated the treatment well. All safety precautions were in place.      Patient would like to switch back to every 4 weeks instead of 8 weeks for maintenance until the left hallux starts to look better

## 2024-01-26 ENCOUNTER — Ambulatory Visit (INDEPENDENT_AMBULATORY_CARE_PROVIDER_SITE_OTHER): Payer: Self-pay | Admitting: Podiatrist

## 2024-01-26 DIAGNOSIS — B351 Tinea unguium: Secondary | ICD-10-CM

## 2024-01-26 NOTE — Progress Notes (Signed)
 Patient presents today for laser maintenance. Diagnosed with mycotic nail infection by Dr. Lydia Sams.    Toenail most affected is the right hallux. Patient concerned about the left hallux looking dark again.    All other systems are negative.   Nails were filed thin. Laser therapy was administered to 1-5 bilateral toenail and patient tolerated the treatment well. All safety precautions were in place.      Patient would like to come in every 4 weeks for maintenance visits with the laser

## 2024-02-14 ENCOUNTER — Other Ambulatory Visit (HOSPITAL_BASED_OUTPATIENT_CLINIC_OR_DEPARTMENT_OTHER): Payer: Self-pay | Admitting: Family Medicine

## 2024-02-14 DIAGNOSIS — Z8781 Personal history of (healed) traumatic fracture: Secondary | ICD-10-CM

## 2024-04-12 ENCOUNTER — Ambulatory Visit (INDEPENDENT_AMBULATORY_CARE_PROVIDER_SITE_OTHER): Payer: Self-pay

## 2024-04-12 DIAGNOSIS — B351 Tinea unguium: Secondary | ICD-10-CM

## 2024-04-12 NOTE — Progress Notes (Signed)
 Patient presents today for  laser maintenance treatment. Diagnosed with mycotic nail infection by Dr. Tobie.   Toenail most affected right hallux.  All other systems are negative.  Nails were filed thin. Laser therapy was administered to 1-5 toenails bilaterally and patient tolerated the treatment well. All safety precautions were in place.    Follow up in 6 weeks for laser maintenance.

## 2024-05-17 ENCOUNTER — Ambulatory Visit (INDEPENDENT_AMBULATORY_CARE_PROVIDER_SITE_OTHER)

## 2024-05-17 DIAGNOSIS — B351 Tinea unguium: Secondary | ICD-10-CM

## 2024-05-17 NOTE — Progress Notes (Signed)
 Patient presents today for laser maintenance treatment. Diagnosed with mycotic nail infection by Dr. Tobie.   Toenail most affected right hallux.  All other systems are negative.  Nails were trimmed and filed thin. Laser therapy was administered to 1-5 toenails bilaterally and patient tolerated the treatment well. All safety precautions were in place.    Follow up in 6 weeks for laser mainenance.

## 2024-06-26 NOTE — Telephone Encounter (Signed)
 Please assist me in scheduling the patient for an ultrasound of his parotid gland in 6 months and a follow-up in clinic 1 week after the ultrasounds completed.

## 2024-06-28 ENCOUNTER — Ambulatory Visit (INDEPENDENT_AMBULATORY_CARE_PROVIDER_SITE_OTHER)

## 2024-06-28 DIAGNOSIS — B351 Tinea unguium: Secondary | ICD-10-CM

## 2024-06-28 NOTE — Progress Notes (Signed)
 Patient presents today for the 6 week laser maintenance treatment. Diagnosed with mycotic nail infection by Dr. Tobie.   Toenail most affected right hallux.  All other systems are negative.  Nails were trimmed and filed thin. Laser therapy was administered to 1-5 toenails bilaterally and patient tolerated the treatment well. All safety precautions were in place.   Patient's nails are showing significant improvement. The right 1st hallux nail medial aspect does have what appears to be the beginning of a potential ingrown nail. Patient reported some discomfort when this was touched. RN gave recommendation to make appointment with provider to have this addressed. Patient in agreement with plan.   Post treatment instructions reviewed and provided to patient. Patient had no questions regarding plan of care.   Follow up in 8 weeks for laser maintenance.

## 2024-07-05 ENCOUNTER — Ambulatory Visit: Admitting: Podiatry

## 2024-07-05 DIAGNOSIS — M79674 Pain in right toe(s): Secondary | ICD-10-CM

## 2024-07-05 DIAGNOSIS — M79675 Pain in left toe(s): Secondary | ICD-10-CM | POA: Diagnosis not present

## 2024-07-05 DIAGNOSIS — B351 Tinea unguium: Secondary | ICD-10-CM

## 2024-07-05 NOTE — Progress Notes (Signed)
  Subjective:  Patient ID: Christopher Collins, male    DOB: [birth year],  MRN: [id number]  Chief Complaint  Patient presents with   Nail Problem    Possible ingrown nail    58 y.o. male returns for the above complaint.  Patient presents with thickened onychodystrophy mycotic nail x 10 more painful just with ambulation and shoe pressure medicine need to be done unable to do so denies any other acute complaints  Objective:  There were no vitals filed for this visit. Podiatric Exam: Vascular: dorsalis pedis and posterior tibial pulses are palpable bilateral. Capillary return is immediate. Temperature gradient is WNL. Skin turgor WNL  Sensorium: Normal Semmes Weinstein monofilament test. Normal tactile sensation bilaterally. Nail Exam: Pt has thick disfigured discolored nails with subungual debris noted bilateral entire nail hallux through fifth toenails.  Pain on palpation to the nails. Ulcer Exam: There is no evidence of ulcer or pre-ulcerative changes or infection. Orthopedic Exam: Muscle tone and strength are WNL. No limitations in general ROM. No crepitus or effusions noted.  Skin: No Porokeratosis. No infection or ulcers    Assessment & Plan:   1. Pain due to onychomycosis of toenails of both feet     Patient was evaluated and treated and all questions answered.  Onychomycosis with pain  -Nails palliatively debrided as below. -Educated on self-care  Procedure: Nail Debridement Rationale: pain  Type of Debridement: manual, sharp debridement. Instrumentation: Nail nipper, rotary burr. Number of Nails: 10  Procedures and Treatment: Consent by patient was obtained for treatment procedures. The patient understood the discussion of treatment and procedures well. All questions were answered thoroughly reviewed. Debridement of mycotic and hypertrophic toenails, 1 through 5 bilateral and clearing of subungual debris. No ulceration, no infection noted.  Return Visit-Office Procedure:  Patient instructed to return to the office for a follow up visit 3 months for continued evaluation and treatment.  Christopher Collins, DPM    No follow-ups on file.

## 2024-07-10 ENCOUNTER — Ambulatory Visit: Admitting: Podiatry

## 2024-07-12 ENCOUNTER — Other Ambulatory Visit (HOSPITAL_BASED_OUTPATIENT_CLINIC_OR_DEPARTMENT_OTHER): Payer: Self-pay

## 2024-07-12 MED ORDER — FLUZONE 0.5 ML IM SUSY
0.5000 mL | PREFILLED_SYRINGE | Freq: Once | INTRAMUSCULAR | 0 refills | Status: AC
  Filled 2024-07-12: qty 0.5, 1d supply, fill #0

## 2024-08-20 ENCOUNTER — Ambulatory Visit (INDEPENDENT_AMBULATORY_CARE_PROVIDER_SITE_OTHER): Payer: Self-pay

## 2024-08-20 DIAGNOSIS — B351 Tinea unguium: Secondary | ICD-10-CM

## 2024-08-20 NOTE — Progress Notes (Signed)
 Patient presents today for laser maintenance treatment. Diagnosed with mycotic nail infection by Dr. Tobie.   Toenail most affected right hallux.  All other systems are negative.  Nails were trimmed and filed thin. Laser therapy was administered to 1-5 toenails bilaterally and patient tolerated the treatment well. All safety precautions were in place.   Post treatment instructions reviewed and provided to patient. Patient had no questions regarding plan of care.   Follow up in 8-12 weeks for laser maintenance.

## 2024-10-03 ENCOUNTER — Ambulatory Visit: Admitting: Podiatry

## 2024-10-15 ENCOUNTER — Ambulatory Visit (INDEPENDENT_AMBULATORY_CARE_PROVIDER_SITE_OTHER): Payer: Self-pay

## 2024-10-15 DIAGNOSIS — B351 Tinea unguium: Secondary | ICD-10-CM

## 2024-12-10 ENCOUNTER — Ambulatory Visit
